# Patient Record
Sex: Female | Born: 1960 | Race: White | Hispanic: No | Marital: Married | Smoking: Never smoker
Health system: Southern US, Community
[De-identification: ages and names within clinical notes are randomized; demographics above are authoritative.]

## PROBLEM LIST (undated history)

## (undated) DIAGNOSIS — F32A Depression, unspecified: Secondary | ICD-10-CM

## (undated) DIAGNOSIS — F419 Anxiety disorder, unspecified: Secondary | ICD-10-CM

## (undated) DIAGNOSIS — E039 Hypothyroidism, unspecified: Secondary | ICD-10-CM

## (undated) DIAGNOSIS — F329 Major depressive disorder, single episode, unspecified: Secondary | ICD-10-CM

## (undated) DIAGNOSIS — D259 Leiomyoma of uterus, unspecified: Secondary | ICD-10-CM

## (undated) DIAGNOSIS — M8580 Other specified disorders of bone density and structure, unspecified site: Secondary | ICD-10-CM

## (undated) DIAGNOSIS — IMO0002 Reserved for concepts with insufficient information to code with codable children: Secondary | ICD-10-CM

## (undated) DIAGNOSIS — M329 Systemic lupus erythematosus, unspecified: Secondary | ICD-10-CM

## (undated) DIAGNOSIS — K219 Gastro-esophageal reflux disease without esophagitis: Secondary | ICD-10-CM

## (undated) DIAGNOSIS — C801 Malignant (primary) neoplasm, unspecified: Secondary | ICD-10-CM

## (undated) HISTORY — DX: Gastro-esophageal reflux disease without esophagitis: K21.9

## (undated) HISTORY — DX: Anxiety disorder, unspecified: F41.9

## (undated) HISTORY — DX: Major depressive disorder, single episode, unspecified: F32.9

## (undated) HISTORY — DX: Depression, unspecified: F32.A

## (undated) HISTORY — DX: Leiomyoma of uterus, unspecified: D25.9

## (undated) HISTORY — DX: Malignant (primary) neoplasm, unspecified: C80.1

## (undated) HISTORY — DX: Reserved for concepts with insufficient information to code with codable children: IMO0002

## (undated) HISTORY — PX: TONSILLECTOMY: SUR1361

## (undated) HISTORY — DX: Systemic lupus erythematosus, unspecified: M32.9

## (undated) HISTORY — DX: Other specified disorders of bone density and structure, unspecified site: M85.80

## (undated) HISTORY — DX: Hypothyroidism, unspecified: E03.9

---

## 2002-10-08 ENCOUNTER — Other Ambulatory Visit: Admission: RE | Admit: 2002-10-08 | Discharge: 2002-10-08 | Payer: Self-pay | Admitting: Obstetrics and Gynecology

## 2003-10-01 ENCOUNTER — Other Ambulatory Visit: Admission: RE | Admit: 2003-10-01 | Discharge: 2003-10-01 | Payer: Self-pay | Admitting: Obstetrics and Gynecology

## 2004-11-24 ENCOUNTER — Other Ambulatory Visit: Admission: RE | Admit: 2004-11-24 | Discharge: 2004-11-24 | Payer: Self-pay | Admitting: Obstetrics and Gynecology

## 2006-04-09 ENCOUNTER — Other Ambulatory Visit: Admission: RE | Admit: 2006-04-09 | Discharge: 2006-04-09 | Payer: Self-pay | Admitting: Obstetrics and Gynecology

## 2007-09-12 HISTORY — PX: BREAST SURGERY: SHX581

## 2007-09-12 HISTORY — PX: PORTACATH PLACEMENT: SHX2246

## 2008-03-29 ENCOUNTER — Encounter: Admission: RE | Admit: 2008-03-29 | Discharge: 2008-03-29 | Payer: Self-pay | Admitting: Radiology

## 2008-05-11 ENCOUNTER — Encounter: Admission: RE | Admit: 2008-05-11 | Discharge: 2008-05-11 | Payer: Self-pay | Admitting: Obstetrics and Gynecology

## 2008-05-13 ENCOUNTER — Ambulatory Visit: Payer: Self-pay | Admitting: Oncology

## 2008-05-14 ENCOUNTER — Encounter (INDEPENDENT_AMBULATORY_CARE_PROVIDER_SITE_OTHER): Payer: Self-pay | Admitting: Surgery

## 2008-05-14 ENCOUNTER — Ambulatory Visit (HOSPITAL_BASED_OUTPATIENT_CLINIC_OR_DEPARTMENT_OTHER): Admission: RE | Admit: 2008-05-14 | Discharge: 2008-05-15 | Payer: Self-pay | Admitting: Surgery

## 2008-05-26 ENCOUNTER — Ambulatory Visit (HOSPITAL_COMMUNITY): Admission: RE | Admit: 2008-05-26 | Discharge: 2008-05-26 | Payer: Self-pay | Admitting: Oncology

## 2008-05-27 LAB — CBC WITH DIFFERENTIAL/PLATELET
BASO%: 0.4 % (ref 0.0–2.0)
EOS%: 2.8 % (ref 0.0–7.0)
Eosinophils Absolute: 0.2 10*3/uL (ref 0.0–0.5)
LYMPH%: 20.8 % (ref 14.0–48.0)
MCH: 31.3 pg (ref 26.0–34.0)
MCHC: 34.5 g/dL (ref 32.0–36.0)
MCV: 90.8 fL (ref 81.0–101.0)
MONO%: 6.1 % (ref 0.0–13.0)
NEUT#: 5.9 10*3/uL (ref 1.5–6.5)
Platelets: 299 10*3/uL (ref 145–400)
RBC: 4.09 10*6/uL (ref 3.70–5.32)
RDW: 12.1 % (ref 11.3–14.5)

## 2008-05-28 LAB — COMPREHENSIVE METABOLIC PANEL
ALT: 35 U/L (ref 0–35)
AST: 18 U/L (ref 0–37)
Albumin: 4.1 g/dL (ref 3.5–5.2)
Alkaline Phosphatase: 55 U/L (ref 39–117)
Potassium: 4.4 mEq/L (ref 3.5–5.3)
Sodium: 138 mEq/L (ref 135–145)
Total Bilirubin: 0.4 mg/dL (ref 0.3–1.2)
Total Protein: 7 g/dL (ref 6.0–8.3)

## 2008-05-28 LAB — CANCER ANTIGEN 27.29: CA 27.29: 22 U/mL (ref 0–39)

## 2008-05-28 LAB — VITAMIN D 25 HYDROXY (VIT D DEFICIENCY, FRACTURES): Vit D, 25-Hydroxy: 21 ng/mL — ABNORMAL LOW (ref 30–89)

## 2008-06-02 ENCOUNTER — Ambulatory Visit: Admission: RE | Admit: 2008-06-02 | Discharge: 2008-06-09 | Payer: Self-pay | Admitting: Radiation Oncology

## 2008-06-03 LAB — PREGNANCY, URINE: Preg Test, Ur: NEGATIVE

## 2008-06-05 ENCOUNTER — Ambulatory Visit: Payer: Self-pay

## 2008-06-05 ENCOUNTER — Encounter: Payer: Self-pay | Admitting: Oncology

## 2008-06-08 ENCOUNTER — Ambulatory Visit (HOSPITAL_BASED_OUTPATIENT_CLINIC_OR_DEPARTMENT_OTHER): Admission: RE | Admit: 2008-06-08 | Discharge: 2008-06-08 | Payer: Self-pay | Admitting: Surgery

## 2008-06-12 ENCOUNTER — Encounter: Admission: RE | Admit: 2008-06-12 | Discharge: 2008-06-12 | Payer: Self-pay | Admitting: Surgery

## 2008-06-16 ENCOUNTER — Ambulatory Visit (HOSPITAL_COMMUNITY): Admission: RE | Admit: 2008-06-16 | Discharge: 2008-06-16 | Payer: Self-pay | Admitting: Oncology

## 2008-06-24 LAB — CBC WITH DIFFERENTIAL/PLATELET
BASO%: 0.9 % (ref 0.0–2.0)
LYMPH%: 40.7 % (ref 14.0–48.0)
MCHC: 34.5 g/dL (ref 32.0–36.0)
MCV: 88.9 fL (ref 81.0–101.0)
MONO%: 17 % — ABNORMAL HIGH (ref 0.0–13.0)
Platelets: 192 10*3/uL (ref 145–400)
RBC: 3.83 10*6/uL (ref 3.70–5.32)
WBC: 3.2 10*3/uL — ABNORMAL LOW (ref 3.9–10.0)

## 2008-07-06 ENCOUNTER — Ambulatory Visit: Payer: Self-pay | Admitting: Oncology

## 2008-07-08 LAB — COMPREHENSIVE METABOLIC PANEL
ALT: 43 U/L — ABNORMAL HIGH (ref 0–35)
Albumin: 4 g/dL (ref 3.5–5.2)
Alkaline Phosphatase: 61 U/L (ref 39–117)
CO2: 27 mEq/L (ref 19–32)
Glucose, Bld: 138 mg/dL — ABNORMAL HIGH (ref 70–99)
Potassium: 4.1 mEq/L (ref 3.5–5.3)
Sodium: 138 mEq/L (ref 135–145)
Total Protein: 6.9 g/dL (ref 6.0–8.3)

## 2008-07-08 LAB — CBC WITH DIFFERENTIAL/PLATELET
BASO%: 0.4 % (ref 0.0–2.0)
MCHC: 34.4 g/dL (ref 32.0–36.0)
MONO#: 0.1 10*3/uL (ref 0.1–0.9)
RBC: 3.85 10*6/uL (ref 3.70–5.32)
RDW: 13 % (ref 11.3–14.5)
WBC: 6.4 10*3/uL (ref 3.9–10.0)
lymph#: 0.9 10*3/uL (ref 0.9–3.3)

## 2008-07-08 LAB — RESEARCH LABS

## 2008-07-16 LAB — CBC WITH DIFFERENTIAL/PLATELET
BASO%: 2.4 % — ABNORMAL HIGH (ref 0.0–2.0)
HCT: 33 % — ABNORMAL LOW (ref 34.8–46.6)
MCHC: 34.1 g/dL (ref 32.0–36.0)
MONO#: 0.8 10*3/uL (ref 0.1–0.9)
NEUT%: 38 % — ABNORMAL LOW (ref 39.6–76.8)
WBC: 4.9 10*3/uL (ref 3.9–10.0)
lymph#: 1.8 10*3/uL (ref 0.9–3.3)

## 2008-07-29 LAB — CBC WITH DIFFERENTIAL/PLATELET
Basophils Absolute: 0 10*3/uL (ref 0.0–0.1)
Eosinophils Absolute: 0 10*3/uL (ref 0.0–0.5)
HCT: 33.8 % — ABNORMAL LOW (ref 34.8–46.6)
HGB: 11.8 g/dL (ref 11.6–15.9)
MCH: 32.2 pg (ref 26.0–34.0)
MONO#: 0.2 10*3/uL (ref 0.1–0.9)
NEUT#: 5.8 10*3/uL (ref 1.5–6.5)
NEUT%: 83.8 % — ABNORMAL HIGH (ref 39.6–76.8)
WBC: 6.9 10*3/uL (ref 3.9–10.0)
lymph#: 0.9 10*3/uL (ref 0.9–3.3)

## 2008-07-29 LAB — COMPREHENSIVE METABOLIC PANEL
ALT: 40 U/L — ABNORMAL HIGH (ref 0–35)
AST: 25 U/L (ref 0–37)
Albumin: 4.4 g/dL (ref 3.5–5.2)
Alkaline Phosphatase: 65 U/L (ref 39–117)
BUN: 16 mg/dL (ref 6–23)
Chloride: 105 mEq/L (ref 96–112)
Potassium: 5.1 mEq/L (ref 3.5–5.3)
Sodium: 135 mEq/L (ref 135–145)

## 2008-08-10 LAB — CBC WITH DIFFERENTIAL/PLATELET
BASO%: 1.7 % (ref 0.0–2.0)
Basophils Absolute: 0.1 10*3/uL (ref 0.0–0.1)
EOS%: 1.4 % (ref 0.0–7.0)
HGB: 10.6 g/dL — ABNORMAL LOW (ref 11.6–15.9)
MCH: 32.6 pg (ref 26.0–34.0)
MCHC: 34.9 g/dL (ref 32.0–36.0)
MCV: 93.4 fL (ref 81.0–101.0)
MONO%: 6.4 % (ref 0.0–13.0)
NEUT%: 69.2 % (ref 39.6–76.8)
RDW: 16.6 % — ABNORMAL HIGH (ref 11.3–14.5)

## 2008-08-14 ENCOUNTER — Ambulatory Visit: Payer: Self-pay

## 2008-08-14 ENCOUNTER — Encounter: Payer: Self-pay | Admitting: Oncology

## 2008-08-19 LAB — CBC WITH DIFFERENTIAL/PLATELET
BASO%: 0.2 % (ref 0.0–2.0)
EOS%: 0.3 % (ref 0.0–7.0)
MCH: 32.8 pg (ref 26.0–34.0)
MCHC: 34.7 g/dL (ref 32.0–36.0)
MONO#: 0.1 10*3/uL (ref 0.1–0.9)
RBC: 3.26 10*6/uL — ABNORMAL LOW (ref 3.70–5.32)
RDW: 18.9 % — ABNORMAL HIGH (ref 11.3–14.5)
WBC: 9.4 10*3/uL (ref 3.9–10.0)
lymph#: 0.8 10*3/uL — ABNORMAL LOW (ref 0.9–3.3)

## 2008-08-19 LAB — COMPREHENSIVE METABOLIC PANEL
ALT: 36 U/L — ABNORMAL HIGH (ref 0–35)
AST: 22 U/L (ref 0–37)
Albumin: 4.2 g/dL (ref 3.5–5.2)
Alkaline Phosphatase: 61 U/L (ref 39–117)
BUN: 13 mg/dL (ref 6–23)
Calcium: 9.3 mg/dL (ref 8.4–10.5)
Chloride: 106 mEq/L (ref 96–112)
Potassium: 5.2 mEq/L (ref 3.5–5.3)

## 2008-08-19 LAB — RESEARCH LABS

## 2008-08-21 ENCOUNTER — Ambulatory Visit: Payer: Self-pay | Admitting: Oncology

## 2008-08-27 LAB — CBC WITH DIFFERENTIAL/PLATELET
BASO%: 1 % (ref 0.0–2.0)
Basophils Absolute: 0 10*3/uL (ref 0.0–0.1)
EOS%: 1.3 % (ref 0.0–7.0)
HCT: 29.2 % — ABNORMAL LOW (ref 34.8–46.6)
HGB: 10.2 g/dL — ABNORMAL LOW (ref 11.6–15.9)
MCH: 33.3 pg (ref 26.0–34.0)
MCHC: 34.9 g/dL (ref 32.0–36.0)
MCV: 95.2 fL (ref 81.0–101.0)
MONO%: 22.6 % — ABNORMAL HIGH (ref 0.0–13.0)
NEUT%: 33.2 % — ABNORMAL LOW (ref 39.6–76.8)
RDW: 18.8 % — ABNORMAL HIGH (ref 11.3–14.5)
lymph#: 1.5 10*3/uL (ref 0.9–3.3)

## 2008-08-27 LAB — RESEARCH LABS

## 2008-09-09 LAB — CBC WITH DIFFERENTIAL/PLATELET
BASO%: 0 % (ref 0.0–2.0)
Basophils Absolute: 0 10*3/uL (ref 0.0–0.1)
Eosinophils Absolute: 0 10*3/uL (ref 0.0–0.5)
HCT: 31.2 % — ABNORMAL LOW (ref 34.8–46.6)
HGB: 10.7 g/dL — ABNORMAL LOW (ref 11.6–15.9)
MONO#: 0.1 10*3/uL (ref 0.1–0.9)
NEUT#: 8.6 10*3/uL — ABNORMAL HIGH (ref 1.5–6.5)
NEUT%: 91.2 % — ABNORMAL HIGH (ref 39.6–76.8)
WBC: 9.4 10*3/uL (ref 3.9–10.0)
lymph#: 0.8 10*3/uL — ABNORMAL LOW (ref 0.9–3.3)

## 2008-09-09 LAB — COMPREHENSIVE METABOLIC PANEL
ALT: 68 U/L — ABNORMAL HIGH (ref 0–35)
BUN: 10 mg/dL (ref 6–23)
CO2: 26 mEq/L (ref 19–32)
Calcium: 9.2 mg/dL (ref 8.4–10.5)
Chloride: 101 mEq/L (ref 96–112)
Creatinine, Ser: 1.07 mg/dL (ref 0.40–1.20)

## 2008-09-11 HISTORY — PX: BREAST RECONSTRUCTION: SHX9

## 2008-09-17 LAB — CBC WITH DIFFERENTIAL/PLATELET
BASO%: 0.9 % (ref 0.0–2.0)
Basophils Absolute: 0 10*3/uL (ref 0.0–0.1)
EOS%: 1.4 % (ref 0.0–7.0)
Eosinophils Absolute: 0 10*3/uL (ref 0.0–0.5)
HCT: 25.5 % — ABNORMAL LOW (ref 34.8–46.6)
HGB: 9 g/dL — ABNORMAL LOW (ref 11.6–15.9)
LYMPH%: 42.1 % (ref 14.0–48.0)
MCH: 35.3 pg — ABNORMAL HIGH (ref 26.0–34.0)
MCHC: 35.2 g/dL (ref 32.0–36.0)
MCV: 100.2 fL (ref 81.0–101.0)
MONO#: 0.4 10*3/uL (ref 0.1–0.9)
MONO%: 22.7 % — ABNORMAL HIGH (ref 0.0–13.0)
NEUT#: 0.5 10*3/uL — ABNORMAL LOW (ref 1.5–6.5)
NEUT%: 32.9 % — ABNORMAL LOW (ref 39.6–76.8)
Platelets: 157 10*3/uL (ref 145–400)
RBC: 2.55 10*6/uL — ABNORMAL LOW (ref 3.70–5.32)
RDW: 19.1 % — ABNORMAL HIGH (ref 11.3–14.5)
WBC: 1.7 10*3/uL — ABNORMAL LOW (ref 3.9–10.0)
lymph#: 0.7 10*3/uL — ABNORMAL LOW (ref 0.9–3.3)

## 2008-09-28 ENCOUNTER — Ambulatory Visit: Payer: Self-pay | Admitting: Oncology

## 2008-10-01 ENCOUNTER — Ambulatory Visit: Admission: RE | Admit: 2008-10-01 | Discharge: 2008-12-30 | Payer: Self-pay | Admitting: Radiation Oncology

## 2008-10-05 ENCOUNTER — Emergency Department (HOSPITAL_COMMUNITY): Admission: EM | Admit: 2008-10-05 | Discharge: 2008-10-05 | Payer: Self-pay | Admitting: Emergency Medicine

## 2008-10-06 ENCOUNTER — Encounter: Payer: Self-pay | Admitting: Oncology

## 2008-10-06 ENCOUNTER — Ambulatory Visit: Admission: RE | Admit: 2008-10-06 | Discharge: 2008-10-06 | Payer: Self-pay | Admitting: Pediatrics

## 2008-10-06 ENCOUNTER — Ambulatory Visit: Payer: Self-pay | Admitting: Surgery

## 2008-10-08 LAB — COMPREHENSIVE METABOLIC PANEL
ALT: 14 U/L (ref 0–35)
AST: 13 U/L (ref 0–37)
Albumin: 3.9 g/dL (ref 3.5–5.2)
CO2: 27 mEq/L (ref 19–32)
Calcium: 8.7 mg/dL (ref 8.4–10.5)
Chloride: 103 mEq/L (ref 96–112)
Creatinine, Ser: 0.82 mg/dL (ref 0.40–1.20)
Potassium: 3.4 mEq/L — ABNORMAL LOW (ref 3.5–5.3)
Sodium: 138 mEq/L (ref 135–145)
Total Protein: 5.9 g/dL — ABNORMAL LOW (ref 6.0–8.3)

## 2008-10-08 LAB — CBC WITH DIFFERENTIAL/PLATELET
BASO%: 0.2 % (ref 0.0–2.0)
HCT: 24.6 % — ABNORMAL LOW (ref 34.8–46.6)
HGB: 8.6 g/dL — ABNORMAL LOW (ref 11.6–15.9)
MCHC: 34.9 g/dL (ref 32.0–36.0)
MONO#: 0.4 10*3/uL (ref 0.1–0.9)
NEUT%: 31.2 % — ABNORMAL LOW (ref 39.6–76.8)
RDW: 15.4 % — ABNORMAL HIGH (ref 11.3–14.5)
WBC: 2.3 10*3/uL — ABNORMAL LOW (ref 3.9–10.0)
lymph#: 1.1 10*3/uL (ref 0.9–3.3)

## 2008-10-15 LAB — CBC WITH DIFFERENTIAL/PLATELET
Basophils Absolute: 0.1 10*3/uL (ref 0.0–0.1)
EOS%: 0.2 % (ref 0.0–7.0)
Eosinophils Absolute: 0 10*3/uL (ref 0.0–0.5)
HGB: 9 g/dL — ABNORMAL LOW (ref 11.6–15.9)
LYMPH%: 22.5 % (ref 14.0–48.0)
MCH: 37.1 pg — ABNORMAL HIGH (ref 26.0–34.0)
MCV: 105.5 fL — ABNORMAL HIGH (ref 81.0–101.0)
MONO%: 5.9 % (ref 0.0–13.0)
NEUT#: 4.7 10*3/uL (ref 1.5–6.5)
Platelets: 51 10*3/uL — ABNORMAL LOW (ref 145–400)
RBC: 2.43 10*6/uL — ABNORMAL LOW (ref 3.70–5.32)
RDW: 15.2 % — ABNORMAL HIGH (ref 11.3–14.5)

## 2008-10-16 ENCOUNTER — Encounter: Payer: Self-pay | Admitting: Oncology

## 2008-10-16 ENCOUNTER — Ambulatory Visit: Payer: Self-pay

## 2008-10-19 LAB — CBC WITH DIFFERENTIAL/PLATELET
Basophils Absolute: 0.1 10*3/uL (ref 0.0–0.1)
EOS%: 0 % (ref 0.0–7.0)
Eosinophils Absolute: 0 10*3/uL (ref 0.0–0.5)
HCT: 25 % — ABNORMAL LOW (ref 34.8–46.6)
HGB: 8.7 g/dL — ABNORMAL LOW (ref 11.6–15.9)
MCH: 37.3 pg — ABNORMAL HIGH (ref 26.0–34.0)
MCV: 106.7 fL — ABNORMAL HIGH (ref 81.0–101.0)
NEUT%: 66.6 % (ref 39.6–76.8)
lymph#: 1.4 10*3/uL (ref 0.9–3.3)

## 2008-10-26 ENCOUNTER — Encounter: Admission: RE | Admit: 2008-10-26 | Discharge: 2009-01-24 | Payer: Self-pay | Admitting: Oncology

## 2008-10-26 LAB — CBC WITH DIFFERENTIAL/PLATELET
BASO%: 0.6 % (ref 0.0–2.0)
Eosinophils Absolute: 0.1 10*3/uL (ref 0.0–0.5)
HGB: 9.9 g/dL — ABNORMAL LOW (ref 11.6–15.9)
MCH: 37.1 pg — ABNORMAL HIGH (ref 26.0–34.0)
MCV: 107.4 fL — ABNORMAL HIGH (ref 81.0–101.0)
MONO#: 0.5 10*3/uL (ref 0.1–0.9)
Platelets: 249 10*3/uL (ref 145–400)
RDW: 16.4 % — ABNORMAL HIGH (ref 11.3–14.5)
lymph#: 1.4 10*3/uL (ref 0.9–3.3)

## 2008-11-03 LAB — CBC WITH DIFFERENTIAL/PLATELET
Basophils Absolute: 0 10*3/uL (ref 0.0–0.1)
Eosinophils Absolute: 0.1 10*3/uL (ref 0.0–0.5)
HCT: 32.5 % — ABNORMAL LOW (ref 34.8–46.6)
HGB: 10.8 g/dL — ABNORMAL LOW (ref 11.6–15.9)
MONO#: 0.5 10*3/uL (ref 0.1–0.9)
NEUT%: 59.4 % (ref 38.4–76.8)
Platelets: 188 10*3/uL (ref 145–400)
WBC: 5.5 10*3/uL (ref 3.9–10.3)
lymph#: 1.6 10*3/uL (ref 0.9–3.3)

## 2008-11-30 ENCOUNTER — Ambulatory Visit: Payer: Self-pay | Admitting: Oncology

## 2008-12-02 LAB — COMPREHENSIVE METABOLIC PANEL
AST: 11 U/L (ref 0–37)
Albumin: 4.2 g/dL (ref 3.5–5.2)
Alkaline Phosphatase: 62 U/L (ref 39–117)
Potassium: 4.1 mEq/L (ref 3.5–5.3)
Sodium: 137 mEq/L (ref 135–145)
Total Bilirubin: 0.3 mg/dL (ref 0.3–1.2)
Total Protein: 6.6 g/dL (ref 6.0–8.3)

## 2008-12-02 LAB — CBC WITH DIFFERENTIAL/PLATELET
BASO%: 0.1 % (ref 0.0–2.0)
EOS%: 2 % (ref 0.0–7.0)
LYMPH%: 17.2 % (ref 14.0–49.7)
MCH: 34.7 pg — ABNORMAL HIGH (ref 25.1–34.0)
MCHC: 34.6 g/dL (ref 31.5–36.0)
MCV: 100.2 fL (ref 79.5–101.0)
MONO%: 9.3 % (ref 0.0–14.0)
NEUT#: 2.8 10*3/uL (ref 1.5–6.5)
Platelets: 179 10*3/uL (ref 145–400)
RBC: 3.46 10*6/uL — ABNORMAL LOW (ref 3.70–5.45)
RDW: 12.9 % (ref 11.2–14.5)

## 2009-01-11 ENCOUNTER — Ambulatory Visit: Payer: Self-pay | Admitting: Oncology

## 2009-01-12 ENCOUNTER — Encounter: Payer: Self-pay | Admitting: Oncology

## 2009-01-12 ENCOUNTER — Ambulatory Visit: Admission: RE | Admit: 2009-01-12 | Discharge: 2009-01-12 | Payer: Self-pay | Admitting: Oncology

## 2009-01-12 ENCOUNTER — Ambulatory Visit: Payer: Self-pay | Admitting: Cardiology

## 2009-01-13 ENCOUNTER — Encounter (INDEPENDENT_AMBULATORY_CARE_PROVIDER_SITE_OTHER): Payer: Self-pay | Admitting: *Deleted

## 2009-01-13 LAB — CBC WITH DIFFERENTIAL/PLATELET
Basophils Absolute: 0 10*3/uL (ref 0.0–0.1)
EOS%: 2.3 % (ref 0.0–7.0)
HGB: 12.1 g/dL (ref 11.6–15.9)
MCH: 32.2 pg (ref 25.1–34.0)
NEUT#: 2.5 10*3/uL (ref 1.5–6.5)
RBC: 3.75 10*6/uL (ref 3.70–5.45)
RDW: 12.6 % (ref 11.2–14.5)
lymph#: 1.1 10*3/uL (ref 0.9–3.3)

## 2009-01-13 LAB — RESEARCH LABS

## 2009-01-13 LAB — COMPREHENSIVE METABOLIC PANEL
ALT: 29 U/L (ref 0–35)
AST: 27 U/L (ref 0–37)
Albumin: 3.7 g/dL (ref 3.5–5.2)
Alkaline Phosphatase: 63 U/L (ref 39–117)
Glucose, Bld: 88 mg/dL (ref 70–99)
Potassium: 3.9 mEq/L (ref 3.5–5.3)
Sodium: 136 mEq/L (ref 135–145)
Total Protein: 6.6 g/dL (ref 6.0–8.3)

## 2009-02-25 ENCOUNTER — Ambulatory Visit: Payer: Self-pay | Admitting: Oncology

## 2009-02-25 LAB — CBC WITH DIFFERENTIAL/PLATELET
Eosinophils Absolute: 0.2 10*3/uL (ref 0.0–0.5)
HCT: 34.3 % — ABNORMAL LOW (ref 34.8–46.6)
LYMPH%: 17.7 % (ref 14.0–49.7)
MONO#: 0.3 10*3/uL (ref 0.1–0.9)
NEUT#: 3.5 10*3/uL (ref 1.5–6.5)
NEUT%: 72.2 % (ref 38.4–76.8)
Platelets: 178 10*3/uL (ref 145–400)
RBC: 3.78 10*6/uL (ref 3.70–5.45)
WBC: 4.8 10*3/uL (ref 3.9–10.3)

## 2009-02-25 LAB — COMPREHENSIVE METABOLIC PANEL
Albumin: 3.8 g/dL (ref 3.5–5.2)
CO2: 27 mEq/L (ref 19–32)
Calcium: 9.5 mg/dL (ref 8.4–10.5)
Glucose, Bld: 99 mg/dL (ref 70–99)
Sodium: 138 mEq/L (ref 135–145)
Total Bilirubin: 0.8 mg/dL (ref 0.3–1.2)
Total Protein: 7 g/dL (ref 6.0–8.3)

## 2009-03-04 LAB — FECAL OCCULT BLOOD, GUAIAC: Occult Blood: NEGATIVE

## 2009-04-07 ENCOUNTER — Ambulatory Visit: Payer: Self-pay | Admitting: Oncology

## 2009-04-12 LAB — CBC WITH DIFFERENTIAL/PLATELET
Eosinophils Absolute: 0.1 10*3/uL (ref 0.0–0.5)
HCT: 33.7 % — ABNORMAL LOW (ref 34.8–46.6)
LYMPH%: 16.6 % (ref 14.0–49.7)
MONO#: 0.4 10*3/uL (ref 0.1–0.9)
NEUT#: 4 10*3/uL (ref 1.5–6.5)
NEUT%: 74 % (ref 38.4–76.8)
Platelets: 175 10*3/uL (ref 145–400)
WBC: 5.4 10*3/uL (ref 3.9–10.3)

## 2009-04-12 LAB — COMPREHENSIVE METABOLIC PANEL
BUN: 12 mg/dL (ref 6–23)
CO2: 27 mEq/L (ref 19–32)
Creatinine, Ser: 0.9 mg/dL (ref 0.40–1.20)
Glucose, Bld: 100 mg/dL — ABNORMAL HIGH (ref 70–99)
Total Bilirubin: 0.5 mg/dL (ref 0.3–1.2)

## 2009-04-28 ENCOUNTER — Ambulatory Visit: Admission: RE | Admit: 2009-04-28 | Discharge: 2009-04-28 | Payer: Self-pay | Admitting: Oncology

## 2009-04-28 ENCOUNTER — Encounter: Payer: Self-pay | Admitting: Oncology

## 2009-04-28 ENCOUNTER — Ambulatory Visit: Payer: Self-pay | Admitting: Internal Medicine

## 2009-05-18 ENCOUNTER — Ambulatory Visit: Payer: Self-pay | Admitting: Oncology

## 2009-05-20 LAB — CBC WITH DIFFERENTIAL/PLATELET
Basophils Absolute: 0 10*3/uL (ref 0.0–0.1)
EOS%: 2.6 % (ref 0.0–7.0)
Eosinophils Absolute: 0.2 10*3/uL (ref 0.0–0.5)
HCT: 35.9 % (ref 34.8–46.6)
HGB: 12.3 g/dL (ref 11.6–15.9)
MCH: 32 pg (ref 25.1–34.0)
MCV: 93.4 fL (ref 79.5–101.0)
MONO%: 6.3 % (ref 0.0–14.0)
NEUT#: 5 10*3/uL (ref 1.5–6.5)
NEUT%: 77 % — ABNORMAL HIGH (ref 38.4–76.8)
Platelets: 210 10*3/uL (ref 145–400)

## 2009-05-20 LAB — COMPREHENSIVE METABOLIC PANEL
AST: 20 U/L (ref 0–37)
Albumin: 3.8 g/dL (ref 3.5–5.2)
Alkaline Phosphatase: 73 U/L (ref 39–117)
BUN: 10 mg/dL (ref 6–23)
Calcium: 9.4 mg/dL (ref 8.4–10.5)
Creatinine, Ser: 0.82 mg/dL (ref 0.40–1.20)
Glucose, Bld: 93 mg/dL (ref 70–99)

## 2009-06-10 LAB — CBC WITH DIFFERENTIAL/PLATELET
Basophils Absolute: 0 10*3/uL (ref 0.0–0.1)
EOS%: 3.1 % (ref 0.0–7.0)
Eosinophils Absolute: 0.2 10*3/uL (ref 0.0–0.5)
HCT: 34.7 % — ABNORMAL LOW (ref 34.8–46.6)
HGB: 12.1 g/dL (ref 11.6–15.9)
MCH: 32.3 pg (ref 25.1–34.0)
MCV: 92.8 fL (ref 79.5–101.0)
NEUT#: 3.8 10*3/uL (ref 1.5–6.5)
NEUT%: 70.7 % (ref 38.4–76.8)
RDW: 12.5 % (ref 11.2–14.5)
lymph#: 1 10*3/uL (ref 0.9–3.3)

## 2009-06-10 LAB — COMPREHENSIVE METABOLIC PANEL
Albumin: 4.2 g/dL (ref 3.5–5.2)
BUN: 17 mg/dL (ref 6–23)
Calcium: 9.4 mg/dL (ref 8.4–10.5)
Chloride: 103 mEq/L (ref 96–112)
Creatinine, Ser: 0.97 mg/dL (ref 0.40–1.20)
Glucose, Bld: 98 mg/dL (ref 70–99)
Potassium: 4.3 mEq/L (ref 3.5–5.3)

## 2009-07-22 ENCOUNTER — Ambulatory Visit: Payer: Self-pay | Admitting: Oncology

## 2009-09-01 ENCOUNTER — Ambulatory Visit: Payer: Self-pay | Admitting: Oncology

## 2009-09-06 LAB — CBC WITH DIFFERENTIAL/PLATELET
EOS%: 2.9 % (ref 0.0–7.0)
Eosinophils Absolute: 0.2 10*3/uL (ref 0.0–0.5)
MCV: 92.7 fL (ref 79.5–101.0)
MONO%: 6.3 % (ref 0.0–14.0)
NEUT#: 4.7 10*3/uL (ref 1.5–6.5)
RBC: 4.12 10*6/uL (ref 3.70–5.45)
RDW: 12.2 % (ref 11.2–14.5)

## 2009-09-07 LAB — COMPREHENSIVE METABOLIC PANEL
ALT: 15 U/L (ref 0–35)
AST: 16 U/L (ref 0–37)
CO2: 26 mEq/L (ref 19–32)
Chloride: 104 mEq/L (ref 96–112)
Creatinine, Ser: 0.83 mg/dL (ref 0.40–1.20)
Sodium: 139 mEq/L (ref 135–145)
Total Bilirubin: 0.4 mg/dL (ref 0.3–1.2)
Total Protein: 6.8 g/dL (ref 6.0–8.3)

## 2009-09-07 LAB — LACTATE DEHYDROGENASE: LDH: 175 U/L (ref 94–250)

## 2009-09-07 LAB — FOLLICLE STIMULATING HORMONE: FSH: 102.7 m[IU]/mL

## 2009-09-11 HISTORY — PX: BREAST SURGERY: SHX581

## 2009-09-12 IMAGING — PT NM PET TUM IMG SKULL BASE T - THIGH
1 of 6 series · 1 of 25 positions shown · non-contrast
Comparison: CT chest abdomen pelvis same day, bone scan 05/26/2008

CLINICAL DATA: New diagnosis breast cancer

NUCLEAR MEDICINE PET/CT
Fasting Blood Glucose:  70
TECHNIQUE: 18.5 mCi F-18 FDG was injected intravenously via the
left wrist.  Full-ring PET imaging was performed from the skull
base through the mid-thighs 75  minutes after injection.  CT data
was obtained and used for attenuation correction and anatomic
localization only.  (This was not acquired as a diagnostic CT
examination.)

[Series 2: ct images · axial · 3.8mm · 0.98mm/px · 1 of 267 slices shown]
[im 267/267  brain]
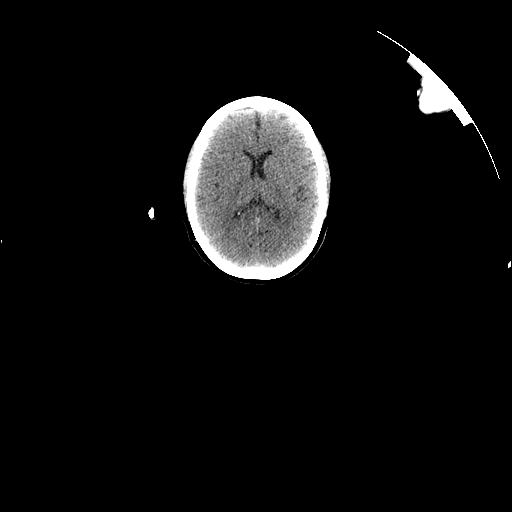

[1 of 25 positions shown; findings below may reference images not displayed]

FINDINGS: Neck: No hypermetabolic nodes in the neck.

Chest: No hypermetabolic mediastinal or hilar nodes.  No suspicious
pulmonary nodules.

Abdomen / Pelvis: No abnormal hypermetabolic activity within the
solid organs.  No evidence of abdominal or pelvic hypermetabolic
nodes

Skeleton:  Heterogeneous metabolic activity within the marrow is
felt to be within normal limits.  No corresponding abnormalities on
the CT comparison or bone scan.
IMPRESSION: 1..  No evidence of metastatic breast cancer.

## 2009-09-15 LAB — ESTRADIOL, ULTRA SENS: Estradiol, Ultra Sensitive: 4 pg/mL

## 2009-10-14 ENCOUNTER — Ambulatory Visit: Payer: Self-pay | Admitting: Oncology

## 2009-12-06 ENCOUNTER — Ambulatory Visit: Payer: Self-pay | Admitting: Oncology

## 2009-12-06 ENCOUNTER — Ambulatory Visit (HOSPITAL_COMMUNITY): Admission: RE | Admit: 2009-12-06 | Discharge: 2009-12-06 | Payer: Self-pay | Admitting: Oncology

## 2009-12-06 ENCOUNTER — Encounter: Payer: Self-pay | Admitting: Oncology

## 2009-12-06 ENCOUNTER — Ambulatory Visit: Payer: Self-pay

## 2009-12-06 ENCOUNTER — Ambulatory Visit: Payer: Self-pay | Admitting: Internal Medicine

## 2009-12-08 LAB — CBC WITH DIFFERENTIAL/PLATELET
BASO%: 0.5 % (ref 0.0–2.0)
Basophils Absolute: 0 10*3/uL (ref 0.0–0.1)
Eosinophils Absolute: 0.1 10*3/uL (ref 0.0–0.5)
HCT: 37.4 % (ref 34.8–46.6)
HGB: 13.1 g/dL (ref 11.6–15.9)
MONO#: 0.4 10*3/uL (ref 0.1–0.9)
NEUT#: 3.5 10*3/uL (ref 1.5–6.5)
NEUT%: 63.4 % (ref 38.4–76.8)
Platelets: 203 10*3/uL (ref 145–400)
WBC: 5.5 10*3/uL (ref 3.9–10.3)
lymph#: 1.4 10*3/uL (ref 0.9–3.3)

## 2009-12-08 LAB — COMPREHENSIVE METABOLIC PANEL
AST: 16 U/L (ref 0–37)
Albumin: 4.3 g/dL (ref 3.5–5.2)
BUN: 13 mg/dL (ref 6–23)
CO2: 22 mEq/L (ref 19–32)
Calcium: 9.3 mg/dL (ref 8.4–10.5)
Chloride: 101 mEq/L (ref 96–112)
Glucose, Bld: 100 mg/dL — ABNORMAL HIGH (ref 70–99)
Potassium: 3.9 mEq/L (ref 3.5–5.3)

## 2009-12-08 LAB — LACTATE DEHYDROGENASE: LDH: 165 U/L (ref 94–250)

## 2010-06-10 ENCOUNTER — Ambulatory Visit (HOSPITAL_BASED_OUTPATIENT_CLINIC_OR_DEPARTMENT_OTHER): Payer: Self-pay | Admitting: Oncology

## 2010-06-14 LAB — CBC WITH DIFFERENTIAL/PLATELET
BASO%: 0.3 % (ref 0.0–2.0)
Basophils Absolute: 0 10*3/uL (ref 0.0–0.1)
EOS%: 3 % (ref 0.0–7.0)
HGB: 13.9 g/dL (ref 11.6–15.9)
MCH: 30.8 pg (ref 25.1–34.0)
MCHC: 33.7 g/dL (ref 31.5–36.0)
MCV: 91.2 fL (ref 79.5–101.0)
MONO%: 6.6 % (ref 0.0–14.0)
NEUT%: 64.8 % (ref 38.4–76.8)
RDW: 12.6 % (ref 11.2–14.5)
lymph#: 1.5 10*3/uL (ref 0.9–3.3)

## 2010-06-14 LAB — COMPREHENSIVE METABOLIC PANEL
ALT: 9 U/L (ref 0–35)
AST: 14 U/L (ref 0–37)
Alkaline Phosphatase: 66 U/L (ref 39–117)
BUN: 12 mg/dL (ref 6–23)
Creatinine, Ser: 0.94 mg/dL (ref 0.40–1.20)
Potassium: 4.3 mEq/L (ref 3.5–5.3)

## 2010-06-14 LAB — VITAMIN D 25 HYDROXY (VIT D DEFICIENCY, FRACTURES): Vit D, 25-Hydroxy: 19 ng/mL — ABNORMAL LOW (ref 30–89)

## 2010-10-02 ENCOUNTER — Encounter: Payer: Self-pay | Admitting: Obstetrics and Gynecology

## 2010-10-02 ENCOUNTER — Encounter: Payer: Self-pay | Admitting: Oncology

## 2010-10-03 ENCOUNTER — Encounter: Payer: Self-pay | Admitting: Radiology

## 2010-12-15 ENCOUNTER — Encounter: Payer: 59 | Admitting: Oncology

## 2010-12-15 DIAGNOSIS — C50219 Malignant neoplasm of upper-inner quadrant of unspecified female breast: Secondary | ICD-10-CM

## 2010-12-15 LAB — CBC WITH DIFFERENTIAL/PLATELET
BASO%: 0.3 % (ref 0.0–2.0)
LYMPH%: 22.5 % (ref 14.0–49.7)
MCHC: 33.9 g/dL (ref 31.5–36.0)
MONO#: 0.4 10*3/uL (ref 0.1–0.9)
MONO%: 8.9 % (ref 0.0–14.0)
Platelets: 235 10*3/uL (ref 145–400)
RBC: 4.08 10*6/uL (ref 3.70–5.45)
WBC: 4.6 10*3/uL (ref 3.9–10.3)

## 2010-12-16 LAB — COMPREHENSIVE METABOLIC PANEL
ALT: 11 U/L (ref 0–35)
Alkaline Phosphatase: 60 U/L (ref 39–117)
CO2: 23 mEq/L (ref 19–32)
Sodium: 139 mEq/L (ref 135–145)
Total Bilirubin: 0.5 mg/dL (ref 0.3–1.2)
Total Protein: 6.9 g/dL (ref 6.0–8.3)

## 2010-12-16 LAB — LIPID PANEL
LDL Cholesterol: 104 mg/dL — ABNORMAL HIGH (ref 0–99)
VLDL: 29 mg/dL (ref 0–40)

## 2010-12-16 LAB — CANCER ANTIGEN 27.29: CA 27.29: 19 U/mL (ref 0–39)

## 2010-12-16 LAB — VITAMIN D 25 HYDROXY (VIT D DEFICIENCY, FRACTURES): Vit D, 25-Hydroxy: 23 ng/mL — ABNORMAL LOW (ref 30–89)

## 2010-12-22 ENCOUNTER — Encounter (HOSPITAL_BASED_OUTPATIENT_CLINIC_OR_DEPARTMENT_OTHER): Payer: 59 | Admitting: Oncology

## 2010-12-26 LAB — COMPREHENSIVE METABOLIC PANEL
AST: 26 U/L (ref 0–37)
Albumin: 3.5 g/dL (ref 3.5–5.2)
Alkaline Phosphatase: 67 U/L (ref 39–117)
Chloride: 96 mEq/L (ref 96–112)
Creatinine, Ser: 0.79 mg/dL (ref 0.4–1.2)
GFR calc Af Amer: >60 mL/min (ref >60)
Potassium: 4.1 mEq/L (ref 3.5–5.1)
Total Bilirubin: 1.4 mg/dL — ABNORMAL HIGH (ref 0.3–1.2)

## 2010-12-26 LAB — CBC
Platelets: 192 10*3/uL (ref 150–400)
WBC: 7.7 10*3/uL (ref 4.0–10.5)

## 2010-12-26 LAB — DIFFERENTIAL
Basophils Absolute: 0 10*3/uL (ref 0.0–0.1)
Basophils Relative: 0 % (ref 0–1)
Lymphs Abs: 0.9 10*3/uL (ref 0.7–4.0)
Monocytes Relative: 1 % — ABNORMAL LOW (ref 3–12)
Neutro Abs: 6.6 10*3/uL (ref 1.7–7.7)

## 2010-12-26 LAB — URINALYSIS, ROUTINE W REFLEX MICROSCOPIC
Bilirubin Urine: NEGATIVE
Nitrite: NEGATIVE
Specific Gravity, Urine: 1.024 (ref 1.005–1.030)
pH: 5.5 (ref 5.0–8.0)

## 2010-12-26 LAB — URINE MICROSCOPIC-ADD ON

## 2010-12-26 LAB — PROTIME-INR: INR: 1 (ref 0.00–1.49)

## 2011-01-24 NOTE — Op Note (Signed)
Jeanne Mcdaniel, Jeanne Mcdaniel                 ACCOUNT NO.:  [id number]   MEDICAL RECORD NO.:  [id number]          PATIENT TYPE:  AMB   LOCATION:  DSC                          FACILITY:  [facility]   PHYSICIAN:  Sandria Bales. Ezzard Standing, M.D.  DATE OF BIRTH:  25-Feb-1961   DATE OF PROCEDURE:  05/14/2008  DATE OF DISCHARGE:                               OPERATIVE REPORT   Date of surgey ??   PREOPERATIVE DIAGNOSIS:  Ductal carcinoma in situ of the right breast.   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   PROCEDURE:  Right mastectomy, injection of methylene blue, right  axillary sentinel lymph node biopsy, and right axillary node dissection.   INDICATIONS FOR PROCEDURE:  Ms. Firestine is a pleasant 50 year old white  female, a patient of Dr. Mosetta Putt, who had an abnormal mammogram  after some mastitis in June 2009.  A mammogram after this showed  microcalcifications of the upper outer quadrant of her right breast.  An  MRI on March 29, 2008, showed an area of 11.2 x 4.6 x 4.1 cm.  Biopsies  here showed ductal carcinoma in situ.  This biopsy was done by Dr.  Yolanda Bonine on March 18, 2008.   This biopsy showed ductal carcinoma in situ.  I discussed with the  patient the options for treatment, but I thought because of the ductial  in situ was so widespread in the right breast  there was no practical  way to do a lumpectomy and should would be best served with mastectomy.  I will plan a sentinel lymph node at the same time.  A discussion was  carried out with the patient regarding her primary surgical treatment  and the need for further treatment would depend on her final pathology.  The indications and potential complications of mastectomy and sentinel  lymph node were explained to the patient.  Potential complications  include, but not limited to bleeding, infection, nerve injury,  recurrence of the tumor, and the need for full axillary  node dissection.   The patient was also expressed an interest in having a simultaneous  reconstruction and she has seen Dr. Pleas Patricia, who plans to put  a tissue expander at the same time.   OPERATIVE NOTE:  The patient was placed in a supine position, given a  general LMA anesthesia.  Her both breasts were prepped with Betadine  scrub and then Betadine solution.  I had marked both breast inframammary  folds.  I had marked an area in her right breast for the excision.  Before she has gone back, she had been injected with 1 mCi of sulfur  colloid.  I identified a potential sentinel lymph node biopsy and I  injected about 2 mL of 4% methylene blue in her breast.  A time-out was  held, identifying the patient, the procedure, the location, I had marked  preoperatively.   First on the right axillary node dissection, I made a separate incision  in the right axilla down to identify with lymph node.  The  lsentinel  ymph node was about 2 cm in size, suspicious on size alone, and was sent  to Pathology.  Garnette Czech called back from a pathology standpoint and  said this was a positive lymph node on touch prep.   I then started my breast dissection, made an elliptical incision around  the breast.  I then developed skin flaps superiorly about 2 or 3  fingerbreadths below the clavicle, medially to the lateral edge of the  sternum, inferiorly to the rectus abdominis muscle, and laterally to the  latissimus dorsi muscle.   I then dissected the breast off the pectoralis major muscle.  I saw no  evidence of any obvious tumor that I was cutting across and dissected  to the axilla where I identified the axillary vein.  She had two main  tributaries coming inferior to the axillary vein that I divided.  I then  swept the axillary contents inferiorly.  A long thoracic nerve of Bell  and thoracodorsal nerves were identified and spared during dissection.   Because her axilla was fatty, I went  back and reexamined the axilla  after I stripped out the axillary content.  She also had 2-3 nodes which  were abnormal and enlarged, again suspicious for potential malignancy.  I identified what I thought were 2 of the highest lymph nodes, which  were adjacent to the axillary vein in the medial aspect of the axilla  and I labeled these the highest nodes #1 and #2.  Then, I took some  additional fatty tissue that clearly had node tissue that I labeled as  just additional axillary contents.   I then irrigated the wound, at this time Dr. Stephens November came and he  scrubbed in and he will dictate the tissue implant and closure of the  wound.   The patient tolerated the procedure well.  Her final pathology is  pending at the time to the dictation.      Sandria Bales. Ezzard Standing, M.D.  Electronically Signed     DHN/MEDQ  D:  05/14/2008  T:  05/15/2008  Job:  [id number]   cc:   Mosetta Putt, M.D.  Heide Scales. Yolanda Bonine, MD  Consuello Bossier., M.D.  Hal Morales, M.D.

## 2011-01-24 NOTE — Op Note (Signed)
NAMEZELL, DOUCETTE                 ACCOUNT NO.:  [id number]   MEDICAL RECORD NO.:  [id number]          PATIENT TYPE:  AMB   LOCATION:  DSC                          FACILITY:  [facility]   PHYSICIAN:  Sandria Bales. Ezzard Standing, M.D.  DATE OF BIRTH:  02-Oct-1960   DATE OF PROCEDURE:  06/08/2008  DATE OF DISCHARGE:                               OPERATIVE REPORT   Date of surgery ??   PREOPERATIVE DIAGNOSIS:  Right breast cancer, need of IV access.   POSTOPERATIVE DIAGNOSIS:  Right breast cancer, need of IV access.   PROCEDURE:  Left internal jugular PowerPort (ultrasound guided).   SURGEON:  Sandria Bales. Ezzard Standing, MD.   FIRST ASSISTANT:  None.   ANESTHESIA:  Regional with 20 mL of 1% Xylocaine.   COMPLICATIONS:  None.   PROCEDURE:  Ms. Gales is a 50 year old white female who has recently  diagnosed with a right breast cancer with multiple lymph nodes positive.  She has seen Dr. Pierce Crane in anticipation of chemotherapy, needs of  IV access.   I discussed with her and her husband Port-A-Cath placement risks  including bleeding, infection, pneumothorax, and thrombus around the  catheter.   OPERATIVE NOTE:  The patient was placed in Trendelenburg position with a  roll under her back, both her arms tucked to her upper chest, and neck  was prepped with Betadine solution.  A time out was held identifying the  patient and the procedure.   I used the [employer], plastic PowerPort for insertion, accessed the  left subclavian vein with the 16 gauge needle, but hit the subclavian  artery twice and pulled the needle out both times.   I then went for a left internal jugular approach.  I covered the  ultrasound, identified the left internal jugular vein, and accessed this  on a single stick.   I then threaded a guidewire down to the superior vena cava.  I then  developed a port on the upper aspect of the left breast, passed the  tubing from the port site over the clavicle, 1 incision up into the  neck  with a second where the second incision was using a reduced introducer  into the superior vena cava.  I tried to position this in the junction  of the superior vena cava of the right atrium.  I then attached the  tubing to the reservoir.  I sewed the reservoir in place with 3-0 Vicryl  suture.  I flushed both the reservoir and the tubing.   I flushed with a dilute heparin 10 units/mL.  I then checked the  positioning with fluoroscopy, again visualized the tip of the junction  of the right atrium superior vena cava.  There was no kink in the tubing  and then I flushed the entire unit with a concentrated 100 units/mL.  I  placed 3-0 Vicryl sutures, subcuticular suture of 3-0 Vicryl for  subcutaneous suture, and 5-0 Vicryl for subcuticular suture, painted the  wounds with tincture of benzoin and Steri-Stripped it.   The patient tolerated the procedure well and was transported to the  recovery room in good condition.  Sponge and needle counts were correct  at the end of the case.      Sandria Bales. Ezzard Standing, M.D.  Electronically Signed     DHN/MEDQ  D:  06/08/2008  T:  06/09/2008  Job:  [id number]   cc:   Pierce Crane, MD

## 2011-01-24 NOTE — Op Note (Signed)
Jeanne Mcdaniel, Jeanne Mcdaniel                 ACCOUNT NO.:  [id number]   MEDICAL RECORD NO.:  [id number]          PATIENT TYPE:  AMB   LOCATION:  DSC                          FACILITY:  [facility]   PHYSICIAN:  Consuello Bossier., M.D.DATE OF BIRTH:  1960-09-21   DATE OF PROCEDURE:  05/14/2008  DATE OF DISCHARGE:                               OPERATIVE REPORT   PREOPERATIVE DIAGNOSES:  1. Personal history of carcinoma of right breast.  2. Acquired absence of right breast.   OPERATION:  Immediate right breast reconstruction with retropectoral  placement of [place] silicone textured tissue expander, 400 mL normal  saline added.   SURGEON:  Pleas Patricia, MD.   ANESTHESIA:  General endotracheal.   FINDINGS:  Please see Dr. Lavonda Jumbo operative report for his right  modified radical mastectomy and axillary lymphadenectomy.  The patient  has had a sentinel lymph node biopsy, which was read as positive and Dr.  Ezzard Standing had proceeded with above surgery.  I talked with Dr. Ezzard Standing about  the possibility of proceeding with immediate reconstruction and both he  and I felt it was a reasonable thing to begin with today with the idea  that if there is any issue or problems or interference with either  chemotherapy or radiotherapy, the tissue expander could be removed.  Attention was turned to the pectoralis major muscle where it was  debrided in the direction of its fibers and the retropectoral space  entered.  With fiberoptic illumination, a retropectoral space was  created freeing up the pectoralis major muscle along its attachments  inferiorly and medially and going beneath the serratus anterior muscle  laterally trying to give some additional support laterally to the tissue  expander.  There was a generous mastectomy field that had been created  because of the patient's large breast and the axillary node dissection  that Dr. Ezzard Standing had done.  Wounds were irrigated with copious amounts of  normal saline.  There was noted to be good hemostasis.  Two 19-mm  Al Pimple round drains were placed through separate stab wound, one  laid in the axilla and one laid on top of the pectoralis major muscle  and in the axilla.  They were sewn in place with #3-0 Prolene.  Following this, the tissue expander was emptied of its air and 300 mL of  normal saline placed and was positioned as accurately as possible.  At  this point, some of the muscle fibers were closed with interrupted #3-0  Vicryl.  Wound was again irrigated further with normal saline.  The skin  closure was then performed using interrupted #3-0 Monocryl placed in the  subcutaneous layer and followed by running subcuticular #3-0 Monocryl.  Steri-Strips, Xeroform, fluffs, ABD and a circumthoracic Ace bandage  were applied.  The patient tolerated the procedure well.  She was able  to be discharged from the operating room to recovery room, subsequently  to be admitted for overnight observation.      Consuello Bossier., M.D.     HH/MEDQ  D:  05/14/2008  T:  05/15/2008  Job:  [id number]

## 2011-03-01 ENCOUNTER — Encounter (INDEPENDENT_AMBULATORY_CARE_PROVIDER_SITE_OTHER): Payer: Self-pay | Admitting: Surgery

## 2011-06-12 LAB — POCT HEMOGLOBIN-HEMACUE: Hemoglobin: 12.5

## 2011-06-12 LAB — GLUCOSE, CAPILLARY: Glucose-Capillary: 70

## 2011-06-16 ENCOUNTER — Other Ambulatory Visit: Payer: Self-pay | Admitting: Oncology

## 2011-06-16 ENCOUNTER — Encounter (HOSPITAL_BASED_OUTPATIENT_CLINIC_OR_DEPARTMENT_OTHER): Payer: 59 | Admitting: Oncology

## 2011-06-16 ENCOUNTER — Other Ambulatory Visit (HOSPITAL_COMMUNITY): Payer: Self-pay

## 2011-06-16 ENCOUNTER — Ambulatory Visit (HOSPITAL_COMMUNITY): Payer: 59 | Attending: Oncology | Admitting: Radiology

## 2011-06-16 ENCOUNTER — Encounter (HOSPITAL_COMMUNITY): Payer: Self-pay | Admitting: Oncology

## 2011-06-16 ENCOUNTER — Other Ambulatory Visit (HOSPITAL_COMMUNITY): Payer: Self-pay | Admitting: Radiology

## 2011-06-16 DIAGNOSIS — I428 Other cardiomyopathies: Secondary | ICD-10-CM | POA: Insufficient documentation

## 2011-06-16 DIAGNOSIS — C50911 Malignant neoplasm of unspecified site of right female breast: Secondary | ICD-10-CM

## 2011-06-16 DIAGNOSIS — Z006 Encounter for examination for normal comparison and control in clinical research program: Secondary | ICD-10-CM | POA: Insufficient documentation

## 2011-06-16 DIAGNOSIS — C50219 Malignant neoplasm of upper-inner quadrant of unspecified female breast: Secondary | ICD-10-CM

## 2011-06-16 DIAGNOSIS — C50919 Malignant neoplasm of unspecified site of unspecified female breast: Secondary | ICD-10-CM | POA: Insufficient documentation

## 2011-06-16 LAB — COMPREHENSIVE METABOLIC PANEL
Albumin: 4.5 g/dL (ref 3.5–5.2)
Alkaline Phosphatase: 64 U/L (ref 39–117)
BUN: 12 mg/dL (ref 6–23)
Creatinine, Ser: 0.99 mg/dL (ref 0.50–1.10)
Glucose, Bld: 99 mg/dL (ref 70–99)
Total Bilirubin: 0.4 mg/dL (ref 0.3–1.2)

## 2011-06-16 LAB — CBC WITH DIFFERENTIAL/PLATELET
BASO%: 0.3 % (ref 0.0–2.0)
Basophils Absolute: 0 10*3/uL (ref 0.0–0.1)
Eosinophils Absolute: 0.2 10*3/uL (ref 0.0–0.5)
HCT: 40 % (ref 34.8–46.6)
HGB: 13.7 g/dL (ref 11.6–15.9)
LYMPH%: 23.5 % (ref 14.0–49.7)
MCHC: 34.3 g/dL (ref 31.5–36.0)
MONO#: 0.4 10*3/uL (ref 0.1–0.9)
NEUT%: 67 % (ref 38.4–76.8)
Platelets: 232 10*3/uL (ref 145–400)
WBC: 5.7 10*3/uL (ref 3.9–10.3)
lymph#: 1.3 10*3/uL (ref 0.9–3.3)

## 2011-06-16 LAB — CANCER ANTIGEN 27.29: CA 27.29: 20 U/mL (ref 0–39)

## 2011-06-16 LAB — VITAMIN D 25 HYDROXY (VIT D DEFICIENCY, FRACTURES): Vit D, 25-Hydroxy: 36 ng/mL (ref 30–89)

## 2011-06-19 ENCOUNTER — Other Ambulatory Visit (HOSPITAL_COMMUNITY): Payer: Self-pay | Admitting: Radiology

## 2011-06-19 ENCOUNTER — Ambulatory Visit (HOSPITAL_COMMUNITY): Payer: Self-pay | Admitting: Radiology

## 2011-06-23 ENCOUNTER — Ambulatory Visit (HOSPITAL_COMMUNITY)
Admission: RE | Admit: 2011-06-23 | Discharge: 2011-06-23 | Disposition: A | Discharge status: Home / Self Care | Payer: 59 | Source: Ambulatory Visit | Attending: Oncology | Admitting: Oncology

## 2011-06-23 DIAGNOSIS — Z853 Personal history of malignant neoplasm of breast: Secondary | ICD-10-CM | POA: Insufficient documentation

## 2011-06-23 DIAGNOSIS — C50911 Malignant neoplasm of unspecified site of right female breast: Secondary | ICD-10-CM

## 2011-06-23 MED ORDER — GADOBENATE DIMEGLUMINE 529 MG/ML IV SOLN
17.0000 mL | Freq: Once | INTRAVENOUS | Status: AC | PRN
  Administered 2011-06-23: 17 mL via INTRAVENOUS

## 2011-07-04 ENCOUNTER — Encounter (HOSPITAL_BASED_OUTPATIENT_CLINIC_OR_DEPARTMENT_OTHER): Payer: 59 | Admitting: Oncology

## 2011-07-04 DIAGNOSIS — Z923 Personal history of irradiation: Secondary | ICD-10-CM

## 2011-07-04 DIAGNOSIS — Z9010 Acquired absence of unspecified breast and nipple: Secondary | ICD-10-CM

## 2011-07-04 DIAGNOSIS — Z853 Personal history of malignant neoplasm of breast: Secondary | ICD-10-CM

## 2011-07-06 ENCOUNTER — Encounter: Payer: Self-pay | Admitting: *Deleted

## 2011-07-06 DIAGNOSIS — C50919 Malignant neoplasm of unspecified site of unspecified female breast: Secondary | ICD-10-CM | POA: Insufficient documentation

## 2011-07-13 ENCOUNTER — Encounter: Payer: Self-pay | Admitting: *Deleted

## 2011-08-24 ENCOUNTER — Encounter (INDEPENDENT_AMBULATORY_CARE_PROVIDER_SITE_OTHER): Payer: Self-pay | Admitting: Surgery

## 2011-11-22 ENCOUNTER — Ambulatory Visit (HOSPITAL_BASED_OUTPATIENT_CLINIC_OR_DEPARTMENT_OTHER): Payer: 59 | Admitting: Oncology

## 2011-11-22 ENCOUNTER — Other Ambulatory Visit (HOSPITAL_BASED_OUTPATIENT_CLINIC_OR_DEPARTMENT_OTHER): Payer: 59 | Admitting: Lab

## 2011-11-22 ENCOUNTER — Encounter: Payer: Self-pay | Admitting: *Deleted

## 2011-11-22 ENCOUNTER — Telehealth: Payer: Self-pay | Admitting: Oncology

## 2011-11-22 VITALS — BP 130/72 | HR 76 | Temp 98.4°F | Ht 64.5 in | Wt 180.5 lb

## 2011-11-22 DIAGNOSIS — C50219 Malignant neoplasm of upper-inner quadrant of unspecified female breast: Secondary | ICD-10-CM

## 2011-11-22 DIAGNOSIS — Z171 Estrogen receptor negative status [ER-]: Secondary | ICD-10-CM

## 2011-11-22 DIAGNOSIS — E559 Vitamin D deficiency, unspecified: Secondary | ICD-10-CM

## 2011-11-22 DIAGNOSIS — C50919 Malignant neoplasm of unspecified site of unspecified female breast: Secondary | ICD-10-CM

## 2011-11-22 DIAGNOSIS — C773 Secondary and unspecified malignant neoplasm of axilla and upper limb lymph nodes: Secondary | ICD-10-CM

## 2011-11-22 LAB — CBC WITH DIFFERENTIAL/PLATELET
Basophils Absolute: 0 10*3/uL (ref 0.0–0.1)
EOS%: 2 % (ref 0.0–7.0)
Eosinophils Absolute: 0.1 10*3/uL (ref 0.0–0.5)
HGB: 13.8 g/dL (ref 11.6–15.9)
MCH: 30.8 pg (ref 25.1–34.0)
MCV: 91 fL (ref 79.5–101.0)
MONO%: 6.5 % (ref 0.0–14.0)
NEUT#: 4.5 10*3/uL (ref 1.5–6.5)
RBC: 4.48 10*6/uL (ref 3.70–5.45)
RDW: 12.2 % (ref 11.2–14.5)
lymph#: 1.5 10*3/uL (ref 0.9–3.3)

## 2011-11-22 LAB — COMPREHENSIVE METABOLIC PANEL
ALT: 13 U/L (ref 0–35)
CO2: 23 mEq/L (ref 19–32)
Calcium: 9.9 mg/dL (ref 8.4–10.5)
Chloride: 100 mEq/L (ref 96–112)
Glucose, Bld: 94 mg/dL (ref 70–99)
Sodium: 137 mEq/L (ref 135–145)
Total Bilirubin: 0.4 mg/dL (ref 0.3–1.2)
Total Protein: 7.5 g/dL (ref 6.0–8.3)

## 2011-11-22 LAB — LACTATE DEHYDROGENASE: LDH: 168 U/L (ref 94–250)

## 2011-11-22 NOTE — Telephone Encounter (Signed)
gve the pt her oct 2013 appt calendar °

## 2011-11-22 NOTE — Progress Notes (Signed)
11/22/11 at 3:24pm- BETH follow up #5- The pt is in the clinic for her continued follow up on the BETH study.  The pt states she has been in her usual state of good health.  She denies any hospitalizations.  She was seen and evaluated by Dr. Donnie Coffin.  Her BP was within normal limits.  She specifically denies any new health problems.  Dr. Renelda Loma nurse, William Hamburger, reviewed all of the pt's current concomitant medications.  The pt's last breast imaging was in October 2012 (bilateral MRI).  She is due for imaging in the fall of 2013.  The pt was informed that her research labs (serum and plasma- 48 months from randomization) will need to be drawn at her next office visit in October 2013.  She will also need to be seen for a physical exam.

## 2011-11-22 NOTE — Progress Notes (Signed)
Hematology and Oncology Follow Up Visit  Jeanne Mcdaniel [id number] 1960-10-12 51 y.o. 11/22/2011 6:18 PM PCP r avva  Principle Diagnosis: T25-year-old woman with history of multinode positive ER/PR negative, HER-2 positive breast cancer status post mastectomy with node dissection adjuvant [facility] chemotherapy completed  November 2010, status post ration therapy completed 12/24/2008 status post TRAM flap reconstruction.  Interim History:  There have been no intercurrent illness, hospitalizations or medication changes. Patient is doing well she continues to work at [employer] for [employer]. She has no complaints. She is taking minimal medication. Her most recent mammogram was within normal limits.  Medications: I have reviewed the patient's current medications.  Allergies: No Known Allergies  Past Medical History, Surgical history, Social history, and Family History were reviewed and updated.  Review of Systems: Constitutional:  Negative for fever, chills, night sweats, anorexia, weight loss, pain. Cardiovascular: negative Respiratory: no cough, shortness of breath, or wheezing Neurological: no TIA or stroke symptoms Dermatological: negative ENT: negative Skin Gastrointestinal: negative Genito-Urinary: negative Hematological and Lymphatic: negative Breast: negative Musculoskeletal: negative Remaining ROS negative.  Physical Exam: Blood pressure 130/72, pulse 76, temperature 98.4 F (36.9 C), temperature source Oral, height 5' 4.5" (1.638 m), weight 180 lb 8 oz (81.874 kg). ECOG: 0 General appearance: alert, cooperative and appears stated age Head: Normocephalic, without obvious abnormality, atraumatic Neck: no adenopathy, no carotid bruit, no JVD, supple, symmetrical, trachea midline and thyroid not enlarged, symmetric, no tenderness/mass/nodules Lymph nodes: Cervical, supraclavicular, and axillary nodes normal. Cardiac : regular rate and rhythm, no murmurs or  gallops Pulmonary:clear to auscultation bilaterally and normal percussion bilaterally Breasts: inspection negative, no nipple discharge or bleeding, no masses or nodularity palpable , status post right TRAM flap. The reconstructed breast appears normal there is some erythema around the periareolar location. No masses that I can appreciate there appeared to be different than before. The left breast as well as normal. Abdomen:soft, non-tender; bowel sounds normal; no masses,  no organomegaly Extremities negative Neuro: alert, oriented, normal speech, no focal findings or movement disorder noted  Lab Results: Lab Results  Component Value Date   WBC 6.5 11/22/2011   HGB 13.8 11/22/2011   HCT 40.8 11/22/2011   MCV 91.0 11/22/2011   PLT 233 11/22/2011     Chemistry      Component Value Date/Time   NA 140 06/16/2011 1054   NA 140 06/16/2011 1054   NA 140 06/16/2011 1054   K 4.0 06/16/2011 1054   K 4.0 06/16/2011 1054   K 4.0 06/16/2011 1054   CL 103 06/16/2011 1054   CL 103 06/16/2011 1054   CL 103 06/16/2011 1054   CO2 25 06/16/2011 1054   CO2 25 06/16/2011 1054   CO2 25 06/16/2011 1054   BUN 12 06/16/2011 1054   BUN 12 06/16/2011 1054   BUN 12 06/16/2011 1054   CREATININE 0.99 06/16/2011 1054   CREATININE 0.99 06/16/2011 1054   CREATININE 0.99 06/16/2011 1054      Component Value Date/Time   CALCIUM 9.4 06/16/2011 1054   CALCIUM 9.4 06/16/2011 1054   CALCIUM 9.4 06/16/2011 1054   ALKPHOS 64 06/16/2011 1054   ALKPHOS 64 06/16/2011 1054   ALKPHOS 64 06/16/2011 1054   AST 15 06/16/2011 1054   AST 15 06/16/2011 1054   AST 15 06/16/2011 1054   ALT 13 06/16/2011 1054   ALT 13 06/16/2011 1054   ALT 13 06/16/2011 1054   BILITOT 0.4 06/16/2011 1054   BILITOT 0.4 06/16/2011 1054  BILITOT 0.4 06/16/2011 1054      .pathology. Radiological Studies: chest X-ray n/a Mammogram Study done recently Bone density n/a  Impression and Plan: Patient is doing well no clinical evidence of recurrence she is on the  Beth study and this 30 months into treatment. As. As such we'll see her in 6 months time with appropriate imaging studies.  More than 50% of the visit was spent in patient-related counselling   Pierce Crane, MD 3/13/20136:18 PM

## 2011-11-23 LAB — VITAMIN D 25 HYDROXY (VIT D DEFICIENCY, FRACTURES): Vit D, 25-Hydroxy: 60 ng/mL (ref 30–89)

## 2012-04-17 ENCOUNTER — Other Ambulatory Visit: Payer: Self-pay | Admitting: *Deleted

## 2012-04-17 DIAGNOSIS — C50919 Malignant neoplasm of unspecified site of unspecified female breast: Secondary | ICD-10-CM

## 2012-05-14 ENCOUNTER — Telehealth: Payer: Self-pay | Admitting: *Deleted

## 2012-05-14 NOTE — Telephone Encounter (Signed)
Spoke with the research nurse on 05-14-2012 okay to move patient to 06-20-2012 starting at 8:30am with labs 9:00 with md

## 2012-05-21 ENCOUNTER — Other Ambulatory Visit: Payer: 59 | Admitting: Lab

## 2012-06-18 ENCOUNTER — Ambulatory Visit: Payer: 59 | Admitting: Oncology

## 2012-06-18 ENCOUNTER — Other Ambulatory Visit: Payer: 59 | Admitting: Lab

## 2012-06-20 ENCOUNTER — Telehealth: Payer: Self-pay | Admitting: *Deleted

## 2012-06-20 ENCOUNTER — Ambulatory Visit (HOSPITAL_BASED_OUTPATIENT_CLINIC_OR_DEPARTMENT_OTHER): Payer: 59 | Admitting: Oncology

## 2012-06-20 ENCOUNTER — Other Ambulatory Visit (HOSPITAL_BASED_OUTPATIENT_CLINIC_OR_DEPARTMENT_OTHER): Payer: 59

## 2012-06-20 ENCOUNTER — Encounter: Payer: Self-pay | Admitting: *Deleted

## 2012-06-20 VITALS — BP 128/79 | HR 73 | Temp 98.4°F | Resp 20 | Ht 64.5 in | Wt 176.8 lb

## 2012-06-20 DIAGNOSIS — C773 Secondary and unspecified malignant neoplasm of axilla and upper limb lymph nodes: Secondary | ICD-10-CM

## 2012-06-20 DIAGNOSIS — E559 Vitamin D deficiency, unspecified: Secondary | ICD-10-CM

## 2012-06-20 DIAGNOSIS — Z171 Estrogen receptor negative status [ER-]: Secondary | ICD-10-CM

## 2012-06-20 DIAGNOSIS — C50219 Malignant neoplasm of upper-inner quadrant of unspecified female breast: Secondary | ICD-10-CM

## 2012-06-20 DIAGNOSIS — C50919 Malignant neoplasm of unspecified site of unspecified female breast: Secondary | ICD-10-CM

## 2012-06-20 LAB — CBC WITH DIFFERENTIAL/PLATELET
BASO%: 0.5 % (ref 0.0–2.0)
Basophils Absolute: 0 10*3/uL (ref 0.0–0.1)
EOS%: 3.2 % (ref 0.0–7.0)
HGB: 13 g/dL (ref 11.6–15.9)
MCH: 31.3 pg (ref 25.1–34.0)
MCHC: 34.4 g/dL (ref 31.5–36.0)
MCV: 90.8 fL (ref 79.5–101.0)
MONO%: 7.3 % (ref 0.0–14.0)
RBC: 4.17 10*6/uL (ref 3.70–5.45)
RDW: 12.2 % (ref 11.2–14.5)
lymph#: 1.2 10*3/uL (ref 0.9–3.3)

## 2012-06-20 LAB — CANCER ANTIGEN 27.29: CA 27.29: 22 U/mL (ref 0–39)

## 2012-06-20 LAB — COMPREHENSIVE METABOLIC PANEL (CC13)
ALT: 11 U/L (ref 0–55)
BUN: 11 mg/dL (ref 7.0–26.0)
CO2: 22 mEq/L (ref 22–29)
Calcium: 9.6 mg/dL (ref 8.4–10.4)
Chloride: 106 mEq/L (ref 98–107)
Creatinine: 0.9 mg/dL (ref 0.6–1.1)
Glucose: 93 mg/dl (ref 70–99)
Total Bilirubin: 0.5 mg/dL (ref 0.20–1.20)

## 2012-06-20 LAB — VITAMIN D 25 HYDROXY (VIT D DEFICIENCY, FRACTURES): Vit D, 25-Hydroxy: 54 ng/mL (ref 30–89)

## 2012-06-20 LAB — RESEARCH LABS

## 2012-06-20 NOTE — Progress Notes (Signed)
Hematology and Oncology Follow Up Visit  Jeanne Mcdaniel [id number] [birth year] 51 y.o. 06/20/2012 9:31 AM PCP r avva  Principle Diagnosis: T69-year-old woman with history of multinode positive ER/PR negative, HER-2 positive breast cancer status post mastectomy with node dissection adjuvant [facility] chemotherapy completed  November 2010, status post ration therapy completed 12/24/2008 status post TRAM flap reconstruction.  Interim History:  There have been no intercurrent illness, hospitalizations or medication changes. Patient is doing well she continues to work at [employer] for [employer]. She has no complaints. She is taking minimal medication. Her most recent mammogram was within normal limits.  Medications: I have reviewed the patient's current medications.  Allergies: No Known Allergies  Past Medical History, Surgical history, Social history, and Family History were reviewed and updated.  Review of Systems: Constitutional:  Negative for fever, chills, night sweats, anorexia, weight loss, pain. Cardiovascular: negative Respiratory: no cough, shortness of breath, or wheezing Neurological: no TIA or stroke symptoms Dermatological: negative ENT: negative Skin Gastrointestinal: negative Genito-Urinary: negative Hematological and Lymphatic: negative Breast: negative Musculoskeletal: negative Remaining ROS negative.  Physical Exam: Blood pressure 128/79, pulse 73, temperature 98.4 F (36.9 C), temperature source Oral, resp. rate 20, height 5' 4.5" (1.638 m), weight 176 lb 12.8 oz (80.196 kg). ECOG: 0 General appearance: alert, cooperative and appears stated age Head: Normocephalic, without obvious abnormality, atraumatic Neck: no adenopathy, no carotid bruit, no JVD, supple, symmetrical, trachea midline and thyroid not enlarged, symmetric, no tenderness/mass/nodules Lymph nodes: Cervical, supraclavicular, and axillary nodes normal. Cardiac : regular rate and rhythm, no  murmurs or gallops Pulmonary:clear to auscultation bilaterally and normal percussion bilaterally Breasts: inspection negative, no nipple discharge or bleeding, no masses or nodularity palpable , status post right TRAM flap. The reconstructed breast appears normal there is some erythema around the periareolar location. No masses that I can appreciate there appeared to be different than before. The left breast as well as normal. Abdomen:soft, non-tender; bowel sounds normal; no masses,  no organomegaly Extremities negative Neuro: alert, oriented, normal speech, no focal findings or movement disorder noted  Lab Results: Lab Results  Component Value Date   WBC 5.3 06/20/2012   HGB 13.0 06/20/2012   HCT 37.9 06/20/2012   MCV 90.8 06/20/2012   PLT 233 06/20/2012     Chemistry      Component Value Date/Time   NA 137 11/22/2011 1452   K 3.9 11/22/2011 1452   CL 100 11/22/2011 1452   CO2 23 11/22/2011 1452   BUN 9 11/22/2011 1452   CREATININE 0.98 11/22/2011 1452      Component Value Date/Time   CALCIUM 9.9 11/22/2011 1452   ALKPHOS 57 11/22/2011 1452   AST 18 11/22/2011 1452   ALT 13 11/22/2011 1452   BILITOT 0.4 11/22/2011 1452      .pathology. Radiological Studies: chest X-ray n/a Mammogram Study done recently Bone density n/a  Impression and Plan: Patient is doing well no clinical evidence of recurrence she is on the [place] study and this 48 months from treatment. As. As such we'll see her in 6 months time with appropriate imaging studies.  More than 50% of the visit was spent in patient-related counselling   Pierce Crane, MD 10/10/20139:31 AM

## 2012-06-20 NOTE — Telephone Encounter (Signed)
Gave patient appointment for lab only one week before the md appointment

## 2012-06-20 NOTE — Progress Notes (Signed)
06/20/12 at 11:10am - BETH follow up visit #6 (36 months from EOT).  The pt was into the cancer center this am for her continued follow up on the BETH study.  The pt specifically denies any new health problems.  She reports she has been working full-time and exercising.  ECOG=0.  She denies any medication changes.  Dr. Renelda Loma nurse, Benetta Spar, reviewed the pt's current medications.  The pt was seen and evaluated today by Dr. Donnie Coffin.  He felt the pt looked well with no concerns for recurrence.  The pt had her annual mammogram on 06/19/12.  Dr. Donnie Coffin reviewed the mammogram from yesterday, and "no suspicious findings" were noted.  The pt was relieved to hear that she is doing well.  The pt requested a copy of her labs be mailed to her.  The research nurse also encouraged the pt to use "mychart- [place]" to obtain her labs and other medical tests.  The research nurse will also mail the pt a copy of her labs from today.  The pt was given her next follow up visit for 6 months.  The pt's 48 months from randomization serum and plasma samples were drawn today.

## 2012-08-21 ENCOUNTER — Encounter: Payer: Self-pay | Admitting: Obstetrics and Gynecology

## 2012-08-21 ENCOUNTER — Ambulatory Visit (INDEPENDENT_AMBULATORY_CARE_PROVIDER_SITE_OTHER): Payer: 59 | Admitting: Obstetrics and Gynecology

## 2012-08-21 VITALS — BP 104/70 | Resp 16 | Ht 65.0 in | Wt 177.0 lb

## 2012-08-21 DIAGNOSIS — Z124 Encounter for screening for malignant neoplasm of cervix: Secondary | ICD-10-CM

## 2012-08-21 DIAGNOSIS — Z01419 Encounter for gynecological examination (general) (routine) without abnormal findings: Secondary | ICD-10-CM

## 2012-08-21 NOTE — Progress Notes (Signed)
Patient ID: ENZA SHONE, female   DOB: 10/12/1960, 51 y.o.   MRN: [id number] Last pap 04/2011 Last Mammo 06/2012 Last Colonoscopy 2012 Last Dexa Scan years ago Primary MD Dr. Felipa Eth Abuse at Home None  Filed Vitals:   08/21/12 1511  BP: 104/70  Resp: 16   ROS: noncontributory  Physical Examination: General appearance - alert, well appearing, and in no distress Neck - supple, no significant adenopathy Chest - clear to auscultation, no wheezes, rales or rhonchi, symmetric air entry Heart - normal rate and regular rhythm Abdomen - soft, nontender, nondistended, no masses or organomegaly Breasts - breasts s/p reconstructive surgery, no suspicious masses, no skin or nipple changes or axillary nodes Pelvic - normal external genitalia, vulva, vagina, cervix, uterus and adnexa Back exam - no CVAT Extremities - no edema, redness or tenderness in the calves or thighs  A/P Pap today  RTO 65yr for AEX

## 2012-08-22 LAB — PAP IG W/ RFLX HPV ASCU

## 2012-10-11 ENCOUNTER — Telehealth: Payer: Self-pay | Admitting: *Deleted

## 2012-10-11 NOTE — Telephone Encounter (Signed)
Left message for pt to return my call about her upcoming appt. 

## 2012-10-17 ENCOUNTER — Telehealth: Payer: Self-pay | Admitting: *Deleted

## 2012-10-17 NOTE — Telephone Encounter (Signed)
Stacey,RN left message for a return call to reschedule her appt.

## 2012-10-21 ENCOUNTER — Telehealth: Payer: Self-pay | Admitting: *Deleted

## 2012-10-21 NOTE — Telephone Encounter (Signed)
Called and left message for a return call to reschedule her appt.

## 2012-11-01 ENCOUNTER — Telehealth: Payer: Self-pay | Admitting: *Deleted

## 2012-11-01 NOTE — Telephone Encounter (Signed)
Left message for a return call to reschedule patient's appt.  

## 2012-11-04 ENCOUNTER — Telehealth: Payer: Self-pay | Admitting: *Deleted

## 2012-11-04 NOTE — Telephone Encounter (Signed)
Received call back from patient to reschedule her appt. Confirmed appt. For 11/25/12 at 1045 with Melissa Cross,NP. Then will become Dr. Darnelle Catalan.

## 2012-11-13 ENCOUNTER — Other Ambulatory Visit: Payer: Self-pay | Admitting: *Deleted

## 2012-11-13 DIAGNOSIS — C50919 Malignant neoplasm of unspecified site of unspecified female breast: Secondary | ICD-10-CM

## 2012-11-18 ENCOUNTER — Other Ambulatory Visit: Payer: 59 | Admitting: Lab

## 2012-11-18 ENCOUNTER — Encounter: Payer: Self-pay | Admitting: *Deleted

## 2012-11-18 ENCOUNTER — Encounter: Payer: Self-pay | Admitting: Oncology

## 2012-11-18 NOTE — Progress Notes (Signed)
Mailed letter & calendar to pt. 

## 2012-11-23 ENCOUNTER — Telehealth: Payer: Self-pay | Admitting: Oncology

## 2012-11-23 NOTE — Telephone Encounter (Signed)
gve a copy of the echo referral to Jeanne Mcdaniel for precert. Per pof the pt will pick up her appts when she comes in the office on 11/25/2012

## 2012-11-25 ENCOUNTER — Encounter: Payer: Self-pay | Admitting: *Deleted

## 2012-11-25 ENCOUNTER — Telehealth: Payer: Self-pay | Admitting: Oncology

## 2012-11-25 ENCOUNTER — Encounter: Payer: Self-pay | Admitting: Gynecologic Oncology

## 2012-11-25 ENCOUNTER — Ambulatory Visit (HOSPITAL_BASED_OUTPATIENT_CLINIC_OR_DEPARTMENT_OTHER): Payer: 59 | Admitting: Gynecologic Oncology

## 2012-11-25 VITALS — BP 132/82 | HR 67 | Temp 98.7°F | Resp 20 | Ht 65.0 in | Wt 188.2 lb

## 2012-11-25 DIAGNOSIS — C50911 Malignant neoplasm of unspecified site of right female breast: Secondary | ICD-10-CM

## 2012-11-25 DIAGNOSIS — Z170 Estrogen receptor positive status [ER+]: Secondary | ICD-10-CM

## 2012-11-25 NOTE — Progress Notes (Signed)
11/25/12 at 11:22am - FU 7- BETH study follow up- The pt was into the clinic this morning for her scheduled follow-up visit.  The pt reports that she is feeling well with no new complaints.  The pt was informed that this is her 42 month visit from her end of treatment.  No research labs were required at this visit.  The pt's vital signs were normal.  The pt's next mammogram will be in October 2014.  The pt was seen and examined today by Warner Mccreedy, NP.  She is establishing care with Dr. Darnelle Catalan since Dr. Donnie Coffin is no longer a physician at the [facility].  The patient's concomitant medications were reviewed with the pt.  She was informed that her echo will be due in October 2014 since it will be 60 months from her randomization.  Dr. Darnelle Catalan will see the pt at her next follow up appointment in October 2014.  The pt will also have her research labs drawn at this visit.

## 2012-11-25 NOTE — Patient Instructions (Signed)
Doing well.  We will schedule you to see the dietician.  Continue your weight loss efforts and increase your physical activity.  Plan to follow up with Dr. Darnelle Catalan in October 2014 with your mammogram and echo before.  Please call for any questions or concerns.

## 2012-11-25 NOTE — Progress Notes (Signed)
ID: [name]   DOB: 02-07-1961  MR#: [id number]  WUJ#:[id number]  PCP:  Dr. Felipa Eth GYN: Dr. Su Hilt SU:  Dr. Algis Downs. Newman  HISTORY OF PRESENT ILLNESS: Jeanne Mcdaniel is a 52 year old [place] woman, who underwent annual mammography every year.  She had some mastitis seen in the left breast.  A mammogram performed on 6/18 and subsequently 6/20 showed abnormal calcifications actually in the right breast, which were confirmed on the subsequent BSGI scan that was performed on 03/11/08.  The areas of calcification seen on the mammogram were 6.8 x 2.4 x 3 cm.  The BSGI documented area of intense activity was an area of 4.8 x 3.6 x 2 cm.  The calcifications appeared to be abnormal and the left breast did not show any obvious abnormalities.  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  A subsequent MRI scan performed on 03/29/08 showed an area of abnormal enhancement over an area 11.2 x 4.6 x 4.1 cm.  No abnormal lymph nodes were seen and the left breast was negative.    The patient elected to undergo a right modified radical mastectomy with sentinel lymph node biopsy with expander placement on 05/14/08 by Dr. Ezzard Standing.  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  Lymphovascular invasion was seen.  Three sentinel lymph nodes were identified, all of which were positive.  There was extracapsular extension seen in one of them.  An additional 14 lymph nodes were removed, 6 of which had metastatic disease.  Ultimately a total of 38 lymph nodes were removed, 22 of which had a metastatic disease both with micrometastases seen as well as large foci of metastases and extracapsular extension.  Prognostic markers resulted ER 0%, PR 0%, Ki67 35%, and HER2 3+.  She was started in the [facility] study and completed cycle one of Taxotere, Carboplatin, and Herceptin on 06/18/08, cycles 2-6 on 07/09/08 to 10/01/08, followed by  Herceptin maintenance for 11 cycles from 10/22/08 to 05/20/09.  She underwent radiation therapy under the care of Dr. Dayton Scrape from 11/10/08 to 12/24/08.  She is also s/p TRAM flap reconstruction.     INTERVAL HISTORY:  She presents for continued follow up today.  She reports intermittent bilateral leg cramps with no pattern for the past several weeks along with weight gain.  She has recently been diagnosed with vertigo that is begin managed by Dr. Felipa Eth.  The patient reports that her provider feels that the vertigo is related to a viral infection and she feels that it is improving but not going away.  She states that she does pilates every Tuesday and has become a vegetarian 8 months ago but has been craving bread.  Patient requesting to see the dietician.  No other issues voiced.  REVIEW OF SYSTEMS: Constitutional: Feels well.  Occasional, tolerable hot flashes reported.  No fever, chills, weakness, or fatigue.  Cardiovascular: No chest pain, shortness of breath, or edema.  Pulmonary: No cough or wheeze.  Gastrointestinal: No nausea, vomiting, or diarrhea. No bright red blood per rectum or change in bowel movement.  Genitourinary: No frequency, urgency, or dysuria. No vaginal bleeding or discharge.  Musculoskeletal: No myalgia or joint pain.  Intermittent bilateral leg cramps. Neurologic: No weakness, numbness, or change in gait.  Psychology: No depression or anxiety.  Mild insomnia, which she uses melatonin for.  PAST MEDICAL HISTORY: Past Medical History  Diagnosis Date  . Cancer     Breast  . Acid reflux     PAST SURGICAL HISTORY: Past Surgical History  Procedure Laterality Date  . Tonsillectomy    . Portacath placement  2009    removed in 2011  . Breast surgery  2009    Mastectomy  . Breast reconstruction  2010  . Breast surgery  2011    reduction    FAMILY HISTORY History reviewed. No pertinent family history.  GYNECOLOGIC HISTORY:  G1, P1.  Menarche at age 30.  She was on  birth control pills for 4 years.    SOCIAL HISTORY: Married, one child, two step-children.  No tobacco use.  Drinks one to two beers daily.  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     ADVANCED DIRECTIVES:  Living will and Healthcare POA  HEALTH MAINTENANCE: History  Substance Use Topics  . Smoking status: Never Smoker   . Smokeless tobacco: Not on file  . Alcohol Use: No    Colonoscopy:  Managed by Dr. Felipa Eth  PAP: 08/22/12  Bone density:  Managed by Dr. Felipa Eth  Lipid panel:Managed by Dr. Felipa Eth   No Known Allergies  Current Outpatient Prescriptions  Medication Sig Dispense Refill  . Cholecalciferol (VITAMIN D-3) 5000 UNITS TABS Take by mouth daily.      . citalopram (CELEXA) 40 MG tablet Take 40 mg by mouth daily.        . fish oil-omega-3 fatty acids 1000 MG capsule Take 1 g by mouth daily.      . Melatonin 3 MG CAPS Take 1 capsule by mouth at bedtime as needed.      Marland Kitchen omeprazole (PRILOSEC) 20 MG capsule Take 20 mg by mouth as needed.      . vitamin B-12 (CYANOCOBALAMIN) 1000 MCG tablet Take 1,000 mcg by mouth daily.       No current facility-administered medications for this visit.    OBJECTIVE: Filed Vitals:   11/25/12 1030  BP: 132/82  Pulse: 67  Temp: 98.7 F (37.1 C)  Resp: 20     Body mass index is 31.32 kg/(m^2).    ECOG FS:  Symptomatic but completely ambulatory  General: Well developed, well nourished female in no acute distress. Alert and oriented x 3.  Head/ Neck: Oropharynx clear.  Sclerae anicteric.  Supple without any enlargements.  Lymph node survey: No cervical, supraclavicular, or axillary adenopathy  Cardiovascular: Regular rate and rhythm. S1 and S2 normal.  Lungs: Clear to auscultation bilaterally. No wheezes/crackles/rhonchi noted.  Skin: No rashes or lesions present. Back: No CVA tenderness.  Abdomen: Abdomen soft, non-tender and obese. Active bowel sounds in all quadrants. No evidence of a fluid wave or abdominal masses.   Breasts: Inspection negative with no nodularity, masses, erythema, or discharge noted bilaterally.  Status post right TRAM flap. Extremities: No bilateral cyanosis, edema, or clubbing.    LAB RESULTS: Lab Results  Component Value Date   WBC 5.3 06/20/2012   NEUTROABS 3.5 06/20/2012   HGB 13.0 06/20/2012   HCT 37.9 06/20/2012   MCV 90.8 06/20/2012   PLT 233 06/20/2012      Chemistry      Component Value Date/Time   NA 139 06/20/2012 0847   NA 137 11/22/2011 1452   K 3.9 06/20/2012 0847   K 3.9 11/22/2011 1452   CL 106 06/20/2012 0847   CL 100 11/22/2011 1452   CO2 22 06/20/2012 0847   CO2 23 11/22/2011 1452  BUN 11.0 06/20/2012 0847   BUN 9 11/22/2011 1452   CREATININE 0.9 06/20/2012 0847   CREATININE 0.98 11/22/2011 1452      Component Value Date/Time   CALCIUM 9.6 06/20/2012 0847   CALCIUM 9.9 11/22/2011 1452   ALKPHOS 63 06/20/2012 0847   ALKPHOS 57 11/22/2011 1452   AST 11 06/20/2012 0847   AST 18 11/22/2011 1452   ALT 11 06/20/2012 0847   ALT 13 11/22/2011 1452   BILITOT 0.50 06/20/2012 0847   BILITOT 0.4 11/22/2011 1452       Lab Results  Component Value Date   LABCA2 22 06/20/2012    No components found with this basename: [id number]    No results found for this basename: INR,  in the last 168 hours  Urinalysis    Component Value Date/Time   COLORURINE AMBER BIOCHEMICALS MAY BE AFFECTED BY COLOR* 10/05/2008 2004   APPEARANCEUR CLOUDY* 10/05/2008 2004   LABSPEC 1.024 10/05/2008 2004   PHURINE 5.5 10/05/2008 2004   GLUCOSEU 100* 10/05/2008 2004   HGBUR NEGATIVE 10/05/2008 2004   BILIRUBINUR NEGATIVE 10/05/2008 2004   KETONESUR TRACE* 10/05/2008 2004   PROTEINUR 100* 10/05/2008 2004   UROBILINOGEN 0.2 10/05/2008 2004   NITRITE NEGATIVE 10/05/2008 2004   LEUKOCYTESUR MODERATE* 10/05/2008 2004    STUDIES: No results found.  ASSESSMENT: 52 y.o. [place] woman: #1  S/P right modified radical mastectomy with sentinel lymph node biopsy on 05/14/08 by Dr. 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  #2  She was started in the [facility] study and completed cycle one of Taxotere, Carboplatin, and Herceptin on 06/18/08, cycles 2-6 on 07/09/08 to 10/01/08, followed by Herceptin maintenance for 11 cycles from 10/22/08 to 05/20/09.    #3  She underwent radiation therapy under the care of Dr. Dayton Scrape from 11/10/08 to 12/24/08.     #4 She is also s/p TRAM flap reconstruction.    PLAN:  She is to follow up with Dr. Darnelle Catalan in October 2014 with research labs at that time or sooner if needed.  She is to have her mammogram and echocardiogram in October 2014 before her appointment per research guidelines for an echo 5 years from the study entry.  She is advised to call for any needs or concerns.  An appointment with the dietician will be arranged.  Increasing physical activity along with eating smaller meals more frequently as an option were discussed.  Taking magnesium over the counter was recommended as a possible option for relieving leg cramps along with stretching on a daily basis.  The patient was reviewed with Dr. Darnelle Catalan, who spoke with the patient about future plans and recommendations.  Jeanne Mcdaniel, Jeanne Mcdaniel    11/25/2012

## 2012-11-25 NOTE — Telephone Encounter (Signed)
gv pt appt schedule for March and October. Pt also given mammo for 07/02/13 @ 2:30 pm @ [facility] (date per pt). Echo to linda for preauth. Pt aware she will be contacted re October echo.

## 2012-11-26 ENCOUNTER — Ambulatory Visit: Payer: 59 | Admitting: Oncology

## 2012-11-27 ENCOUNTER — Ambulatory Visit: Payer: 59 | Admitting: Nutrition

## 2012-11-27 NOTE — Progress Notes (Signed)
This patient is a 52 year old female patient of Dr. Darnelle Catalan diagnosed with breast cancer in 2009.  Past medical history includes acid reflux.  Medications include vitamin D 3, Celexa, omega-3 fatty acids, Prilosec, and vitamin B12.  Labs were reviewed.  Height: 65 inches. Weight: 188.2 pounds. Usual body weight: 177 pounds December 2013. BMI: 31.32. (obese) Estimated needs for weight loss: Approximately 1400 calories, 70-85 g protein, 1.8 L fluid daily.  Patient states she became vegetarian 8 months ago. She exercises once a week taking a pilates class. Dietary recall reveals patient eats 3 meals a day and snacks in between.  She is not consuming protein foods at most meals. Patient states she will eat fish. Overall diet appears to be low in fiber and actually low in vegetable intake. Patient consumes juices and diet soft drinks between meals and with meals. She is requesting information on how to make changes to her diet to achieve weight loss.  Nutrition diagnosis: Nutrition related knowledge deficit related to lack of prior exposure to accurate nutrition related information evidenced by patient's verbalization of incomplete information.  Intervention: I educated patient on the importance of regular meals including protein and high fiber fruits, vegetables, and whole grains at meals. I've encouraged her to monitor portion control. I've encouraged her to limit diet soft drinks and juices as beverages. Discussed the importance of increased vegetables and plant based proteins. I have specifically reviewed ways to improve meal choices. Provided patient with multiple fact sheet she can take with her and review. Also recommended she utilize www.myfitnesspal which is an app for diet and weight loss. Patient was educated to increase exercise to 150 minutes weekly. Teach back method used.  Goals: Patient will modify diet to include more plant based proteins and more fiber rich foods. Weight loss goals  of 1-2 pounds weekly.  Next visit: Patient will contact me if she has questions.  No followup scheduled.

## 2012-11-28 ENCOUNTER — Telehealth: Payer: Self-pay | Admitting: Oncology

## 2012-11-28 NOTE — Telephone Encounter (Signed)
Echo appt has already been moved from March to October.

## 2012-12-09 ENCOUNTER — Other Ambulatory Visit (HOSPITAL_COMMUNITY): Payer: 59

## 2013-01-15 ENCOUNTER — Encounter: Payer: Self-pay | Admitting: *Deleted

## 2013-01-15 NOTE — Progress Notes (Signed)
01/15/13 at 2:13pm - The pt called the research nurse after receiving 2 messages from the research nurse about new information regarding her study participation in the [facility] study.  The pt apologized for the delay in returning the nurse's call.  The pt stated she is on vacation in [place] this week.  The research nurse informed the pt that the study closed to follow up as of 12/26/12.  The pt was told that no further data will be collected or reported to the study.  She was told to keep her October appointments with Dr. Darnelle Catalan.  She is aware that Dr. Darnelle Catalan wants her echocardiogram to be done as standard of care.  The pt was told that the study will not pay for this echo.  She was told that her insurance will be responsible for covering this test.  The research nurse answered all of the patient's questions.  The pt was encouraged to call the research nurse if she has any further questions/concerns about the study.

## 2013-01-16 ENCOUNTER — Encounter: Payer: Self-pay | Admitting: *Deleted

## 2013-01-16 NOTE — Progress Notes (Signed)
01/16/13 at 2:28pm - Late entry note from 01/15/13 - The pt confirmed that she has not had a recurrence of her breast cancer as of the April 17th study closure date.

## 2013-06-24 ENCOUNTER — Other Ambulatory Visit: Payer: 59 | Admitting: Lab

## 2013-06-24 ENCOUNTER — Ambulatory Visit: Payer: 59 | Admitting: Oncology

## 2013-06-26 ENCOUNTER — Other Ambulatory Visit (HOSPITAL_COMMUNITY): Payer: 59

## 2013-07-01 ENCOUNTER — Ambulatory Visit (HOSPITAL_COMMUNITY): Payer: 59 | Attending: Cardiology | Admitting: Radiology

## 2013-07-01 ENCOUNTER — Other Ambulatory Visit (HOSPITAL_COMMUNITY): Payer: Self-pay | Admitting: Oncology

## 2013-07-01 ENCOUNTER — Encounter (INDEPENDENT_AMBULATORY_CARE_PROVIDER_SITE_OTHER): Payer: Self-pay

## 2013-07-01 DIAGNOSIS — I429 Cardiomyopathy, unspecified: Secondary | ICD-10-CM

## 2013-07-01 DIAGNOSIS — C50911 Malignant neoplasm of unspecified site of right female breast: Secondary | ICD-10-CM

## 2013-07-01 DIAGNOSIS — C50919 Malignant neoplasm of unspecified site of unspecified female breast: Secondary | ICD-10-CM | POA: Insufficient documentation

## 2013-07-01 DIAGNOSIS — I428 Other cardiomyopathies: Secondary | ICD-10-CM | POA: Insufficient documentation

## 2013-07-01 DIAGNOSIS — I2510 Atherosclerotic heart disease of native coronary artery without angina pectoris: Secondary | ICD-10-CM | POA: Insufficient documentation

## 2013-07-01 NOTE — Progress Notes (Signed)
Echocardiogram performed.  

## 2013-07-02 ENCOUNTER — Other Ambulatory Visit: Payer: Self-pay | Admitting: Physician Assistant

## 2013-07-02 DIAGNOSIS — C50919 Malignant neoplasm of unspecified site of unspecified female breast: Secondary | ICD-10-CM

## 2013-07-03 ENCOUNTER — Ambulatory Visit (HOSPITAL_BASED_OUTPATIENT_CLINIC_OR_DEPARTMENT_OTHER): Payer: 59 | Admitting: Oncology

## 2013-07-03 ENCOUNTER — Other Ambulatory Visit (HOSPITAL_BASED_OUTPATIENT_CLINIC_OR_DEPARTMENT_OTHER): Payer: 59 | Admitting: Lab

## 2013-07-03 VITALS — BP 117/71 | HR 96 | Temp 98.3°F | Resp 18 | Ht 65.0 in | Wt 186.7 lb

## 2013-07-03 DIAGNOSIS — Z853 Personal history of malignant neoplasm of breast: Secondary | ICD-10-CM

## 2013-07-03 DIAGNOSIS — C50919 Malignant neoplasm of unspecified site of unspecified female breast: Secondary | ICD-10-CM

## 2013-07-03 LAB — COMPREHENSIVE METABOLIC PANEL (CC13)
ALT: 12 U/L (ref 0–55)
AST: 15 U/L (ref 5–34)
Alkaline Phosphatase: 71 U/L (ref 40–150)
Anion Gap: 10 mEq/L (ref 3–11)
CO2: 23 mEq/L (ref 22–29)
Creatinine: 1 mg/dL (ref 0.6–1.1)
Glucose: 116 mg/dl (ref 70–140)
Sodium: 141 mEq/L (ref 136–145)
Total Bilirubin: 0.4 mg/dL (ref 0.20–1.20)
Total Protein: 7.3 g/dL (ref 6.4–8.3)

## 2013-07-03 LAB — CBC WITH DIFFERENTIAL/PLATELET
BASO%: 0.8 % (ref 0.0–2.0)
EOS%: 2.7 % (ref 0.0–7.0)
HCT: 37.9 % (ref 34.8–46.6)
LYMPH%: 26.6 % (ref 14.0–49.7)
MCH: 29.6 pg (ref 25.1–34.0)
MCHC: 33.4 g/dL (ref 31.5–36.0)
MCV: 88.6 fL (ref 79.5–101.0)
MONO%: 7.4 % (ref 0.0–14.0)
NEUT#: 4.1 10*3/uL (ref 1.5–6.5)
NEUT%: 62.5 % (ref 38.4–76.8)
Platelets: 237 10*3/uL (ref 145–400)
RBC: 4.28 10*6/uL (ref 3.70–5.45)
RDW: 13.3 % (ref 11.2–14.5)

## 2013-07-03 NOTE — Progress Notes (Signed)
ID: Jeanne Mcdaniel   DOB: 01-Apr-1961  MR#: [id number]  NWG#:[id number]  PCP:  Dr. Felipa Eth GYN: Dr. Su Hilt SU:  Dr. Algis Downs. Newman  HISTORY OF PRESENT ILLNESS: Jeanne Mcdaniel is a 52 year old [place] woman, who underwent annual mammography every year.  She had some mastitis seen in the left breast.  A mammogram performed on 6/18 and subsequently 6/20 showed abnormal calcifications actually in the right breast, which were confirmed on the subsequent BSGI scan that was performed on 03/11/08.  The areas of calcification seen on the mammogram were 6.8 x 2.4 x 3 cm.  The BSGI documented area of intense activity was an area of 4.8 x 3.6 x 2 cm.  The calcifications appeared to be abnormal and the left breast did not show any obvious abnormalities.  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  A subsequent MRI scan performed on 03/29/08 showed an area of abnormal enhancement over an area 11.2 x 4.6 x 4.1 cm.  No abnormal lymph nodes were seen and the left breast was negative.    The patient elected to undergo a right modified radical mastectomy with sentinel lymph node biopsy with expander placement on 05/14/08 by Dr. Ezzard Standing.  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  Lymphovascular invasion was seen.  Three sentinel lymph nodes were identified, all of which were positive.  There was extracapsular extension seen in one of them.  An additional 14 lymph nodes were removed, 6 of which had metastatic disease.  Ultimately a total of 38 lymph nodes were removed, 22 of which had a metastatic disease both with micrometastases seen as well as large foci of metastases and extracapsular extension.  Prognostic markers resulted ER 0%, PR 0%, Ki67 35%, and HER2 3+.  She was started in the [facility] study and completed cycle one of Taxotere, Carboplatin, and Herceptin on 06/18/08, cycles 2-6 on 07/09/08 to 10/01/08, followed by  Herceptin maintenance for 11 cycles from 10/22/08 to 05/20/09.  She underwent radiation therapy under the care of Dr. Dayton Scrape from 11/10/08 to 12/24/08.  She is also s/p TRAM flap reconstruction.     INTERVAL HISTORY:  Jeanne Mcdaniel returns for routine followup. Life is "good", and the interval history is unremarkable. She works out in [employer] 3-4 days a week. She just had her first grandchild.  REVIEW OF SYSTEMS: A detailed review of systems today was entirely negative.  PAST MEDICAL HISTORY: Past Medical History  Diagnosis Date  . Cancer     Breast  . Acid reflux     PAST SURGICAL HISTORY: Past Surgical History  Procedure Laterality Date  . Tonsillectomy    . Portacath placement  2009    removed in 2011  . Breast surgery  2009    Mastectomy  . Breast reconstruction  2010  . Breast surgery  2011    reduction    FAMILY HISTORY No family history on file.  GYNECOLOGIC HISTORY:  G1, P1.  Menarche at age 17.  She was on birth control pills for 4 years.    SOCIAL HISTORY: Married, one child, two step-children.  No tobacco use.  Drinks one to two beers daily.  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     ADVANCED DIRECTIVES:  Living will and Healthcare POA in place  HEALTH MAINTENANCE: History  Substance Use Topics  .  Smoking status: Never Smoker   . Smokeless tobacco: Not on file  . Alcohol Use: No    Colonoscopy:  Managed by Dr. Felipa Eth  PAP: 08/22/12  Bone density:  Managed by Dr. Felipa Eth  Lipid panel:Managed by Dr. Felipa Eth   No Known Allergies  Current Outpatient Prescriptions  Medication Sig Dispense Refill  . Cholecalciferol (VITAMIN D-3) 5000 UNITS TABS Take by mouth daily.      . citalopram (CELEXA) 40 MG tablet Take 40 mg by mouth daily.        . fish oil-omega-3 fatty acids 1000 MG capsule Take 1 g by mouth daily.      . Melatonin 3 MG CAPS Take 1 capsule by mouth at bedtime as needed.      Marland Kitchen omeprazole (PRILOSEC) 20 MG capsule Take 20 mg by mouth as  needed.      . vitamin B-12 (CYANOCOBALAMIN) 1000 MCG tablet Take 1,000 mcg by mouth daily.       No current facility-administered medications for this visit.    OBJECTIVE: Middle-aged woman in no acute distress Filed Vitals:   07/03/13 1435  BP: 117/71  Pulse: 96  Temp: 98.3 F (36.8 C)  Resp: 18     Body mass index is 31.07 kg/(m^2).    ECOG FS:  0  Sclerae unicteric Oropharynx clear No cervical or supraclavicular adenopathy Lungs no rales or rhonchi Heart regular rate and rhythm Abd benign MSK no focal spinal tenderness, no peripheral edema Neuro: nonfocal Breasts: The right breast is status post mastectomy with TRAM reconstruction. There is no evidence of local recurrence. The right axilla is benign per the left breast is unremarkable   LAB RESULTS: Outside labs from Dr. Vicente Males office were also reviewed  Lab Results  Component Value Date   WBC 6.6 07/03/2013   NEUTROABS 4.1 07/03/2013   HGB 12.7 07/03/2013   HCT 37.9 07/03/2013   MCV 88.6 07/03/2013   PLT 237 07/03/2013      Chemistry      Component Value Date/Time   NA 141 07/03/2013 1421   NA 137 11/22/2011 1452   K 3.9 07/03/2013 1421   K 3.9 11/22/2011 1452   CL 106 06/20/2012 0847   CL 100 11/22/2011 1452   CO2 23 07/03/2013 1421   CO2 23 11/22/2011 1452   BUN 7.2 07/03/2013 1421   BUN 9 11/22/2011 1452   CREATININE 1.0 07/03/2013 1421   CREATININE 0.98 11/22/2011 1452      Component Value Date/Time   CALCIUM 9.6 07/03/2013 1421   CALCIUM 9.9 11/22/2011 1452   ALKPHOS 71 07/03/2013 1421   ALKPHOS 57 11/22/2011 1452   AST 15 07/03/2013 1421   AST 18 11/22/2011 1452   ALT 12 07/03/2013 1421   ALT 13 11/22/2011 1452   BILITOT 0.40 07/03/2013 1421   BILITOT 0.4 11/22/2011 1452       Lab Results  Component Value Date   LABCA2 22 06/20/2012    No components found with this basename: [id number]    No results found for this basename: INR,  in the last 168 hours  Urinalysis    Component Value  Date/Time   COLORURINE AMBER BIOCHEMICALS MAY BE AFFECTED BY COLOR* 10/05/2008 2004   APPEARANCEUR CLOUDY* 10/05/2008 2004   LABSPEC 1.024 10/05/2008 2004   PHURINE 5.5 10/05/2008 2004   GLUCOSEU 100* 10/05/2008 2004   HGBUR NEGATIVE 10/05/2008 2004   BILIRUBINUR NEGATIVE 10/05/2008 2004   KETONESUR TRACE* 10/05/2008 2004   PROTEINUR 100* 10/05/2008  2004   UROBILINOGEN 0.2 10/05/2008 2004   NITRITE NEGATIVE 10/05/2008 2004   LEUKOCYTESUR MODERATE* 10/05/2008 2004    STUDIES: [facility] Mamm[facility] was unremarkable   ASSESSMENT: 52 y.o. [place] woman:  #1  S/P right modified radical mastectomy with sentinel lymph node biopsy 05/14/08 for a pT3 pN3, Stage IIIC, invasive ductal carcinoma, grade 3 , estrogen and progesterone receptor negative but HER-2 amplified  #2  She enrolled in the [facility] study and completed cycle one of Taxotere, Carboplatin, and Herceptin on 06/18/08, cycles 2-6 on 07/09/08 to 10/01/08, followed by Herceptin maintenance for 11 cycles from 10/22/08 to 05/20/09.    #3  She underwent radiation therapy under the care of Dr. Dayton Scrape from 11/10/08 to 12/24/08.     #4 She is s/p TRAM flap reconstruction.    PLAN: Jeanne Mcdaniel is now 5 years out from her definitive surgery, with no evidence of disease recurrence. We discussed the fact that estrogen receptor negative breast cancers, if we are to recur, then to recur early. Accordingly I am comfortable releasing her to her primary care physician. She is comfortable with this decision as well.  She will need yearly mammography on the left and a yearly physician breast exam. We will be glad to see her again anytime in the future if and when the need arises, but as of now no further routine appointments are being made for her here.  Jeanne Mcdaniel C    07/03/2013

## 2013-07-04 LAB — VITAMIN D 25 HYDROXY (VIT D DEFICIENCY, FRACTURES): Vit D, 25-Hydroxy: 47 ng/mL (ref 30–89)

## 2013-07-07 ENCOUNTER — Telehealth: Payer: Self-pay | Admitting: Oncology

## 2013-07-07 NOTE — Telephone Encounter (Signed)
Faxed office note to Dr. Felipa Eth

## 2015-10-04 ENCOUNTER — Other Ambulatory Visit: Payer: Self-pay | Admitting: Surgery

## 2015-10-04 DIAGNOSIS — N644 Mastodynia: Secondary | ICD-10-CM

## 2015-10-08 ENCOUNTER — Ambulatory Visit
Admission: RE | Admit: 2015-10-08 | Discharge: 2015-10-08 | Disposition: A | Discharge status: Home / Self Care | Payer: 59 | Source: Ambulatory Visit | Attending: Surgery | Admitting: Surgery

## 2015-10-08 MED ORDER — GADOBENATE DIMEGLUMINE 529 MG/ML IV SOLN
17.0000 mL | Freq: Once | INTRAVENOUS | Status: AC | PRN
  Administered 2015-10-08: 17 mL via INTRAVENOUS

## 2016-02-09 ENCOUNTER — Ambulatory Visit (INDEPENDENT_AMBULATORY_CARE_PROVIDER_SITE_OTHER): Payer: 59 | Admitting: Neurology

## 2016-02-09 ENCOUNTER — Encounter: Payer: Self-pay | Admitting: Neurology

## 2016-02-09 VITALS — BP 130/88 | HR 86 | Resp 20 | Ht 64.5 in | Wt 185.0 lb

## 2016-02-09 DIAGNOSIS — R0683 Snoring: Secondary | ICD-10-CM | POA: Diagnosis not present

## 2016-02-09 DIAGNOSIS — G4730 Sleep apnea, unspecified: Secondary | ICD-10-CM

## 2016-02-09 DIAGNOSIS — E669 Obesity, unspecified: Secondary | ICD-10-CM | POA: Diagnosis not present

## 2016-02-09 DIAGNOSIS — G4710 Hypersomnia, unspecified: Secondary | ICD-10-CM

## 2016-02-09 DIAGNOSIS — R5383 Other fatigue: Secondary | ICD-10-CM

## 2016-02-09 NOTE — Progress Notes (Signed)
SLEEP MEDICINE CLINIC   Provider:  Larey Seat, M D  Referring Provider: Eston Esters, MD Primary Care Physician:  Tivis Ringer, MD  Chief Complaint  Patient presents with  . New Patient (Initial Visit)    had a research study, wakes up 11 times at night, rm 10, alone    HPI:  Jeanne Mcdaniel is a 55 y.o. female , seen here as a referral from Dr. Dagmar Hait,   Mrs. 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 She wore as part of a research study the equivalent of a fit bit, this was called  readi- band. This band measured her movement during sleep and interpreted the gather data as such that the patient would have 11 interruptions from sleep and would not perform to the maximum of her capacity. The program also equates sleep fragmentation with alcohol use. She also feels fatigued. The daytime sleepiness is associated with significant snoring and witnessed apnea as her husband had reported. This is one of the reasons of Dr. Elsworth Soho would like to set up a sleep study.  Past medical history is positive for tendinitis, fatigue, coronary artery disease, hyperlipidemia, hypothyroidism and a history of adenocarcinoma of the right breast diagnosed in 2009 status post mastectomy and then breast reconstruction in 2010 by Dr. Lucia Gaskins and Dr. Dessie Coma.    Sleep habits are as follows: The patient goes usually to bed between 9:30 and 10 PM and likes to watch TV there. Actually it's her husband who likes to watch TV. They share the bedroom. The bedroom would otherwise be called quiet and dark. She prefers to sleep either supine or on her side and she sleeps on 2 pillows. She does not usually fall asleep before bedtime. She will finally go to sleep at about 10 PM. On occasion, she will take a melatonin but not nightly. As quoted above her husband has witnessed her to snore and to have apneas. She has one early morning bathroom break,  otherwise she is not aware of sleep interruptions and she does not leaves the bed. Her 3 legged dog wakes her to go outside at 5 AM. Otherwise she has no interruption in her sleep. She wakes unrefreshed at about 7 with an alarm. She naturally wakes at 8 when she has not to go to work.  Lately her dog has caused her to leave the bed more often at about 5 AM and she is not every morning able to reinitiate sleep. She does not wake up with headaches and usually not with a dry mouth. The mornings for her night ended at 5 AM are followed by a day of fatigue. She does not take daytime naps.  Sleep medical history and family sleep history:  Childhood isolated events of  sleep walking , no enuresis and no night terror.  Social history: She has usually less than one cup of coffee in the morning, and sometimes she will havetea- Usually green tea. No soda and no tobacco use in any form ,alcohol ; 1-2 beers a day.   Review of Systems: Out of a complete 14 system review, the patient complains of only the following symptoms, and all other reviewed systems are negative.  How likely are you to doze in the following situations: 0 = not likely, 1 = slight chance, 2 = moderate chance, 3 = high chance  Sitting and Reading? 0Watching Television?3 Sitting inactive in a public place (theater or  meeting)?0 As a passenger in a car for an hour without a break?0  Lying down in the afternoon when circumstances permit?1 Sitting and talking to someone?0 Sitting quietly after lunch without alcohol?0In a car, while stopped for a few minutes in traffic?0  Total = 4-5    , Fatigue severity score 36  , Phq 2 -0    Social History   Social History  . Marital Status: Married    Spouse Name: N/A  . Number of Children: N/A  . Years of Education: N/A   Occupational History  . Not on file.   Social History Main Topics  . Smoking status: Never Smoker   . Smokeless tobacco: Not on file  . Alcohol Use: 8.4 oz/week    14  Standard drinks or equivalent per week  . Drug Use: No  . Sexual Activity: Not on file   Other Topics Concern  . Not on file   Social History Narrative    Family History  Problem Relation Age of Onset  . Heart disease Father   . Hypertension Father   . Hyperlipidemia Father   . Seizures Sister   . Thalassemia Sister   . Dementia Mother     Past Medical History  Diagnosis Date  . Cancer (HCC)     Breast  . Acid reflux   . Depression   . Lupus ([place])   . Anxiety   . Uterine fibroid   . Hypothyroid   . Osteopenia     Past Surgical History  Procedure Laterality Date  . Tonsillectomy    . Portacath placement  2009    removed in 2011  . Breast surgery  2009    Mastectomy  . Breast reconstruction  2010  . Breast surgery  2011    reduction    Current Outpatient Prescriptions  Medication Sig Dispense Refill  . buPROPion (WELLBUTRIN SR) 100 MG 12 hr tablet Take 100 mg by mouth daily.    . Cholecalciferol (VITAMIN D-3) 5000 UNITS TABS Take by mouth daily.    . citalopram (CELEXA) 40 MG tablet Take 40 mg by mouth daily.      . fish oil-omega-3 fatty acids 1000 MG capsule Take 1 g by mouth daily.    . fluticasone (FLONASE) 50 MCG/ACT nasal spray     . levothyroxine (SYNTHROID, LEVOTHROID) 75 MCG tablet Take 75 mcg by mouth daily before breakfast.    . Melatonin 3 MG CAPS Take 1 capsule by mouth at bedtime as needed.    Marland Kitchen omeprazole (PRILOSEC) 20 MG capsule Take 20 mg by mouth as needed.    . rosuvastatin (CRESTOR) 5 MG tablet Take 5 mg by mouth daily.     No current facility-administered medications for this visit.    Allergies as of 02/09/2016  . (No Known Allergies)    Vitals: Ht 5' 4.5" (1.638 m)  Wt 185 lb (83.915 kg)  BMI 31.28 kg/m2 Last Weight:  Wt Readings from Last 1 Encounters:  02/09/16 185 lb (83.915 kg)   [id number] mass index is 31.28 kg/(m^2).     Last Height:   Ht Readings from Last 1 Encounters:  02/09/16 5' 4.5" (1.638 m)    Physical  exam:  General: The patient is awake, alert and appears not in acute distress. The patient is well groomed. Head: Normocephalic, atraumatic. Neck is supple. Mallampati 2,  neck circumference:15. Nasal airflow patent , . Retrognathia is seen.  Cardiovascular:  Regular rate and rhythm, without  murmurs or  carotid bruit, and without distended neck veins. Respiratory: Lungs are clear to auscultation. Skin:  Without evidence of edema, or rash Trunk: BMI is elevated  The patient's posture is erect    Neurologic exam : The patient is awake and alert, oriented to place and time.   Memory subjective  described as intact.   Attention span & concentration ability appears normal.  Speech is fluent,  without dysarthria, dysphonia or aphasia.  Mood and affect are appropriate.  Cranial nerves: Pupils are equal and briskly reactive to light. Funduscopic exam without  evidence of pallor or edema. Extraocular movements  in vertical and horizontal planes intact and without nystagmus. Visual fields by finger perimetry are intact. Hearing to finger rub intact. Facial sensation intact to fine touch.Facial motor strength is symmetric and tongue and uvula move midline. Shoulder shrug was symmetrical.   Motor exam:  Normal tone, muscle bulk and symmetric strength in all extremities. Sensory:  Fine touch, pinprick and vibration were tested in all extremities. Proprioception tested in the upper extremities was normal. Coordination: Rapid alternating movements in the fingers/hands was normal. Finger-to-nose maneuver  normal without evidence of ataxia, dysmetria or tremor. Gait and station: Patient walks without assistive device and is able unassisted to climb up to the exam table. Strength within normal limits.  Stance is stable and normal.  Toe /Tandem gait is unfragmented. Turns with  3  Steps.  Deep tendon reflexes: in the  upper and lower extremities are symmetric and intact. Babinski maneuver response is  downgoing.  The patient was advised of the nature of the diagnosed sleep disorder , the treatment options and risks for general a health and wellness arising from not treating the condition.  I spent more than 45 minutes of face to face time with the patient. Greater than 50% of time was spent in counseling and coordination of care. We have discussed the diagnosis and differential and I answered the patient's questions.     Assessment:  After physical and neurologic examination, review of laboratory studies,  Personal review of imaging studies, reports of other /same  Imaging studies ,  Results of polysomnography/ neurophysiology testing and pre-existing records as far as provided in visit., my assessment is   1) I am less concerned about the read I band indication of fragmented sleep, as is inked that this is an erroneous reading. The patient is not aware of fragmentation of her sleep. Her husband has witnessed her to snore and to hold her breath at night, I think that it is reasonable to evaluate her for apnea. I will arrange for a split test. The protocol is such that after 2 hours of diagnostic sleep study apnea is found will be treated the same night. He can arrange for a split night 6 days a week. Should the patient have very mild apnea and is mainly snoring without significant oxygen depletion, a dental device should be used. She has mild retrognathia and if a dental device can push her lower jaw slightly forward she would create space within the upper airway, reducing snoring and apnea. Should hypoxemia be present a dental device is usually not the preferred option.  Plan:  Treatment plan and additional workup :  SPLIT 2 ,  Rv after sleep test.    Larey Seat MD  02/09/2016   CC:  Dagmar Hait, MD    Eston Esters, Md No address on file

## 2016-04-13 ENCOUNTER — Ambulatory Visit (INDEPENDENT_AMBULATORY_CARE_PROVIDER_SITE_OTHER): Payer: 59 | Admitting: Neurology

## 2016-04-13 DIAGNOSIS — R5383 Other fatigue: Secondary | ICD-10-CM

## 2016-04-13 DIAGNOSIS — G4710 Hypersomnia, unspecified: Secondary | ICD-10-CM

## 2016-04-13 DIAGNOSIS — E669 Obesity, unspecified: Secondary | ICD-10-CM

## 2016-04-13 DIAGNOSIS — G4730 Sleep apnea, unspecified: Secondary | ICD-10-CM | POA: Diagnosis not present

## 2016-04-13 DIAGNOSIS — R0683 Snoring: Secondary | ICD-10-CM

## 2016-04-26 ENCOUNTER — Telehealth: Payer: Self-pay

## 2016-04-26 NOTE — Telephone Encounter (Signed)
I called pt to discuss sleep study results with pt. No answer, left a message asking her to call me back.

## 2016-04-26 NOTE — Telephone Encounter (Signed)
Pt returned Kristen's call °

## 2016-04-26 NOTE — Telephone Encounter (Signed)
I spoke to Jeanne Mcdaniel. I advised her that her sleep study does reveal sleep apnea and treatment is advised. PAP therapy is preferred but the main therapy is avoiding supine sleep. However, if this is not possible, Dr. Brett Fairy recommends proceeding with a cpap titration study to optimize therapy. Alternative therapies may include: oral appliance and positional therapy as well as weight loss. I advised Jeanne Mcdaniel to avoid caffeine containing beverages and chocolate.  Jeanne Mcdaniel says she is undecided on what she will do and prefers a follow up appt with Dr. Brett Fairy. A follow up appt was made for 9/13/2017at 8:30am. Jeanne Mcdaniel wants me to call her if a sooner appt comes available. I will fax a copy of this sleep study to Dr. Dagmar Hait. Jeanne Mcdaniel verbalized understanding of results. Jeanne Mcdaniel had no questions at this time but was encouraged to call back if questions arise.

## 2016-05-24 ENCOUNTER — Ambulatory Visit (INDEPENDENT_AMBULATORY_CARE_PROVIDER_SITE_OTHER): Payer: Self-pay | Admitting: Neurology

## 2016-05-24 DIAGNOSIS — Z0289 Encounter for other administrative examinations: Secondary | ICD-10-CM

## 2016-06-01 ENCOUNTER — Ambulatory Visit (INDEPENDENT_AMBULATORY_CARE_PROVIDER_SITE_OTHER): Payer: 59 | Admitting: Neurology

## 2016-06-01 ENCOUNTER — Encounter: Payer: Self-pay | Admitting: Neurology

## 2016-06-01 VITALS — BP 128/82 | HR 76 | Resp 20 | Ht 64.5 in | Wt 182.0 lb

## 2016-06-01 DIAGNOSIS — G4733 Obstructive sleep apnea (adult) (pediatric): Secondary | ICD-10-CM

## 2016-06-01 NOTE — Progress Notes (Signed)
S[facility]EP [facility]INE [facility]IC   Provider:  Larey Seat, M D  Referring Provider: Prince Solian, MD Primary Care Physician:  Tivis Ringer, MD  Chief Complaint  Patient presents with  . Follow-up    sleep study results    HPI:  Jeanne Mcdaniel is a 55 y.o. female , seen here as a referral from Dr. Dagmar Hait,   Mrs. 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 She wore as part of a research study the equivalent of a fit bit, this was called  readi- band. This band measured her movement during sleep and interpreted the gather data as such that the patient would have 11 interruptions from sleep and would not perform to the maximum of her capacity. The program also equates sleep fragmentation with alcohol use. She also feels fatigued. The daytime sleepiness is associated with significant snoring and witnessed apnea as her husband had reported. This is one of the reasons of Dr. Elsworth Soho would like to set up a sleep study.  Past medical history is positive for tendinitis, fatigue, coronary artery disease, hyperlipidemia, hypothyroidism and a history of adenocarcinoma of the right breast diagnosed in 2009 status post mastectomy and then breast reconstruction in 2010 by Dr. Lucia Gaskins and Dr. Dessie Coma.    Sleep habits are as follows: The patient goes usually to bed between 9:30 and 10 PM and likes to watch TV there. Actually it's her husband who likes to watch TV. They share the bedroom. The bedroom would otherwise be called quiet and dark. She prefers to sleep either supine or on her side and she sleeps on 2 pillows. She does not usually fall asleep before bedtime. She will finally go to sleep at about 10 PM. On occasion, she will take a melatonin but not nightly. As quoted above her husband has witnessed her to snore and to have apneas. She has one early morning bathroom break, otherwise she is not aware of sleep interruptions and she  does not leaves the bed. Her 3 legged dog wakes her to go outside at 5 AM. Otherwise she has no interruption in her sleep. She wakes unrefreshed at about 7 with an alarm. She naturally wakes at 8 when she has not to go to work.  Lately her dog has caused her to leave the bed more often at about 5 AM and she is not every morning able to reinitiate sleep. She does not wake up with headaches and usually not with a dry mouth. The mornings for her night ended at 5 AM are followed by a day of fatigue. She does not take daytime naps.  Sleep medical history and family sleep history:  Childhood isolated events of  sleep walking , no enuresis and no night terror.  Social history: She has usually less than one cup of coffee in the morning, and sometimes she will havetea- Usually green tea. No soda and no tobacco use in any form ,alcohol ; 1-2 beers a day.   Interval history from 06/01/2016, As the pleasure of seeing Mrs. Neuburger today following a polysomnography dated 04/13/2016. The patient was diagnosed with a moderately severe degree of apnea at an AHI of 29.5 accentuated strongly the supine sleep. In the supine position the AHI was 55, in nonsupine 8.2. There was also hypoxemia noted the nadir being 83% oxygenation and the total time of desaturation at 28.8 minutes. The few periodic limb movements at that not lead to significant  arousals.    Review of Systems: Out of a complete 14 system review, the patient complains of only the following symptoms, and all other reviewed systems are negative.  How likely are you to doze in the following situations: 0 = not likely, 1 = slight chance, 2 = moderate chance, 3 = high chance  Sitting and Reading? 0Watching Television?2 Sitting inactive in a public place (theater or meeting)?0 As a passenger in a car for an hour without a break?0  Lying down in the afternoon when circumstances permit?1 Sitting and talking to someone?0 Sitting quietly after lunch without  alcohol?0In a car, while stopped for a few minutes in traffic?0  Total = 3   , Fatigue severity score 36  , Phq 2 -0    Social History   Social History  . Marital status: Married    Spouse name: N/A  . Number of children: N/A  . Years of education: N/A   Occupational History  . Not on file.   Social History Main Topics  . Smoking status: Never Smoker  . Smokeless tobacco: Not on file  . Alcohol use 7.2 oz/week    12 Standard drinks or equivalent per week  . Drug use: No  . Sexual activity: Not on file   Other Topics Concern  . Not on file   Social History Narrative   Drinks about 2 cups of caffeine daily.    Family History  Problem Relation Age of Onset  . Heart disease Father   . Hypertension Father   . Hyperlipidemia Father   . Seizures Sister   . Thalassemia Sister   . Dementia Mother     Past Medical History:  Diagnosis Date  . Acid reflux   . Anxiety   . Cancer (HCC)    Breast  . Depression   . Hypothyroid   . Lupus ([place])   . Osteopenia   . Uterine fibroid     Past Surgical History:  Procedure Laterality Date  . BREAST RECONSTRUCTION  2010  . BREAST SURGERY  2009   Mastectomy  . BREAST SURGERY  2011   reduction  . PORTACATH PLACEMENT  2009   removed in 2011  . TONSILLECTOMY      Current Outpatient Prescriptions  Medication Sig Dispense Refill  . buPROPion (WELLBUTRIN SR) 100 MG 12 hr tablet Take 100 mg by mouth daily.    . Cholecalciferol (VITAMIN D-3) 5000 UNITS TABS Take by mouth daily.    . citalopram (CELEXA) 40 MG tablet Take 40 mg by mouth daily.      . fish oil-omega-3 fatty acids 1000 MG capsule Take 1 g by mouth daily.    . fluticasone (FLONASE) 50 MCG/ACT nasal spray     . levothyroxine (SYNTHROID, LEVOTHROID) 75 MCG tablet Take 75 mcg by mouth daily before breakfast.    . Melatonin 3 MG CAPS Take 1 capsule by mouth at bedtime as needed.    Marland Kitchen omeprazole (PRILOSEC) 20 MG capsule Take 20 mg by mouth as needed.    .  rosuvastatin (CRESTOR) 5 MG tablet Take 5 mg by mouth daily.     No current facility-administered medications for this visit.     Allergies as of 06/01/2016  . (No Known Allergies)    Vitals: BP 128/82   Pulse 76   Resp 20   Ht 5' 4.5" (1.638 m)   Wt 182 lb (82.6 kg)   BMI 30.76 kg/m  Last Weight:  Wt Readings from Last  1 Encounters:  06/01/16 182 lb (82.6 kg)   [id number] mass index is 30.76 kg/m.     Last Height:   Ht Readings from Last 1 Encounters:  06/01/16 5' 4.5" (1.638 m)     She lost weight.   Physical exam:  General: The patient is awake, alert and appears not in acute distress. The patient is well groomed. Head: Normocephalic, atraumatic. Neck is supple. Mallampati 2,  neck circumference:15. Nasal airflow patent , . Retrognathia is seen.  Cardiovascular:  Regular rate and rhythm, without  murmurs or carotid bruit, and without distended neck veins. Respiratory: Lungs are clear to auscultation. Skin:  Without evidence of edema, or rash Trunk: BMI is elevated  The patient's posture is erect    Neurologic exam : The patient is awake and alert, oriented to place and time.    Cranial nerves: Pupils are equal and briskly reactive to light. Funduscopic exam without  evidence of pallor or edema. Extraocular movements  in vertical and horizontal planes intact and without nystagmus. Visual fields by finger perimetry are intact. Hearing to finger rub intact. Facial sensation intact to fine touch.Facial motor strength is symmetric and tongue and uvula move midline. Shoulder shrug was symmetrical.   The patient was advised of the nature of the diagnosed sleep disorder , the treatment options and risks for general a health and wellness arising from not treating the condition.  I spent more than 15 minutes of face to face time with the patient. Greater than 50% of time was spent in counseling and coordination of care. We have discussed the diagnosis and differential and I  answered the patient's questions.     Assessment:  After physical and neurologic examination, review of laboratory studies,  Personal review of imaging studies, reports of other /same  Imaging studies ,  Results of polysomnography/ neurophysiology testing and pre-existing records as far as provided in visit., my assessment is   1) I discussed with the patient to change her sleep behavior, in terms of the sleep position. Mrs. Winstel had a very strongly accentuated positional apnea, reaching an AHI of 55 in supine position only. I explained the tennis ball method to her, this may be all she needs to avoid suffering from sleep apnea at night and usually snoring is not predominant when the patient is sleeping nonsupine. She does have some trouble finding restful sleep because her husband insists on a TV in the bedroom and would be nice if he could work on changing his habits, allowing her for better and more restful sleep. In addition she has already implemented weight loss strategies. She will implement a lower carb diet.     Asencion Partridge Brigett Estell MD  06/01/2016   CC:  Dagmar Hait, MD    [address], [address], [place] [address]

## 2016-06-01 NOTE — Patient Instructions (Signed)
Please remember to try to maintain good sleep hygiene, which means: Keep a regular sleep and wake schedule, try not to exercise or have a meal within 2 hours of your bedtime, try to keep your bedroom conducive for sleep, that is, cool and dark, without light distractors such as an illuminated alarm clock, and refrain from watching TV right before sleep or in the middle of the night and do not keep the TV or radio on during the night. Also, try not to use or play on electronic devices at bedtime, such as your cell phone, tablet PC or laptop. If you like to read at bedtime on an electronic device, try to dim the background light as much as possible. Do not eat in the middle of the night.     

## 2016-12-14 DIAGNOSIS — L821 Other seborrheic keratosis: Secondary | ICD-10-CM | POA: Diagnosis not present

## 2016-12-14 DIAGNOSIS — D1801 Hemangioma of skin and subcutaneous tissue: Secondary | ICD-10-CM | POA: Diagnosis not present

## 2016-12-14 DIAGNOSIS — L814 Other melanin hyperpigmentation: Secondary | ICD-10-CM | POA: Diagnosis not present

## 2017-01-10 DIAGNOSIS — H40013 Open angle with borderline findings, low risk, bilateral: Secondary | ICD-10-CM | POA: Diagnosis not present

## 2017-01-10 DIAGNOSIS — H21233 Degeneration of iris (pigmentary), bilateral: Secondary | ICD-10-CM | POA: Diagnosis not present

## 2017-05-31 DIAGNOSIS — Z23 Encounter for immunization: Secondary | ICD-10-CM | POA: Diagnosis not present

## 2017-06-29 DIAGNOSIS — Z853 Personal history of malignant neoplasm of breast: Secondary | ICD-10-CM | POA: Diagnosis not present

## 2017-06-29 DIAGNOSIS — Z1231 Encounter for screening mammogram for malignant neoplasm of breast: Secondary | ICD-10-CM | POA: Diagnosis not present

## 2017-08-17 DIAGNOSIS — M859 Disorder of bone density and structure, unspecified: Secondary | ICD-10-CM | POA: Diagnosis not present

## 2017-08-17 DIAGNOSIS — E038 Other specified hypothyroidism: Secondary | ICD-10-CM | POA: Diagnosis not present

## 2017-08-17 DIAGNOSIS — Z0000 Encounter for general adult medical examination without abnormal findings: Secondary | ICD-10-CM | POA: Diagnosis not present

## 2017-08-17 DIAGNOSIS — E7849 Other hyperlipidemia: Secondary | ICD-10-CM | POA: Diagnosis not present

## 2017-08-24 DIAGNOSIS — Z1212 Encounter for screening for malignant neoplasm of rectum: Secondary | ICD-10-CM | POA: Diagnosis not present

## 2017-08-24 DIAGNOSIS — K219 Gastro-esophageal reflux disease without esophagitis: Secondary | ICD-10-CM | POA: Diagnosis not present

## 2017-08-24 DIAGNOSIS — G4733 Obstructive sleep apnea (adult) (pediatric): Secondary | ICD-10-CM | POA: Diagnosis not present

## 2017-08-24 DIAGNOSIS — E038 Other specified hypothyroidism: Secondary | ICD-10-CM | POA: Diagnosis not present

## 2017-08-24 DIAGNOSIS — Z0000 Encounter for general adult medical examination without abnormal findings: Secondary | ICD-10-CM | POA: Diagnosis not present

## 2017-08-28 DIAGNOSIS — H40013 Open angle with borderline findings, low risk, bilateral: Secondary | ICD-10-CM | POA: Diagnosis not present

## 2017-08-28 DIAGNOSIS — H21233 Degeneration of iris (pigmentary), bilateral: Secondary | ICD-10-CM | POA: Diagnosis not present

## 2018-06-01 DIAGNOSIS — Z23 Encounter for immunization: Secondary | ICD-10-CM | POA: Diagnosis not present

## 2018-07-05 DIAGNOSIS — Z853 Personal history of malignant neoplasm of breast: Secondary | ICD-10-CM | POA: Diagnosis not present

## 2018-07-05 DIAGNOSIS — Z1231 Encounter for screening mammogram for malignant neoplasm of breast: Secondary | ICD-10-CM | POA: Diagnosis not present

## 2024-07-17 ENCOUNTER — Other Ambulatory Visit: Payer: Self-pay | Admitting: Internal Medicine

## 2024-07-17 DIAGNOSIS — E785 Hyperlipidemia, unspecified: Secondary | ICD-10-CM

## 2024-07-25 ENCOUNTER — Ambulatory Visit
Admission: RE | Admit: 2024-07-25 | Discharge: 2024-07-25 | Disposition: A | Discharge status: Home / Self Care | Source: Ambulatory Visit | Attending: Internal Medicine | Admitting: Internal Medicine

## 2024-07-25 DIAGNOSIS — E785 Hyperlipidemia, unspecified: Secondary | ICD-10-CM
